# Patient Record
Sex: Female | Born: 1935 | Race: White | Hispanic: No | Smoking: Former smoker
Health system: Southern US, Community
[De-identification: ages and names within clinical notes are randomized; demographics above are authoritative.]

## PROBLEM LIST (undated history)

## (undated) DIAGNOSIS — I340 Nonrheumatic mitral (valve) insufficiency: Secondary | ICD-10-CM

## (undated) DIAGNOSIS — N183 Chronic kidney disease, stage 3 unspecified: Secondary | ICD-10-CM

## (undated) DIAGNOSIS — Z9981 Dependence on supplemental oxygen: Secondary | ICD-10-CM

## (undated) DIAGNOSIS — I5032 Chronic diastolic (congestive) heart failure: Secondary | ICD-10-CM

## (undated) DIAGNOSIS — I10 Essential (primary) hypertension: Secondary | ICD-10-CM

## (undated) DIAGNOSIS — I272 Pulmonary hypertension, unspecified: Secondary | ICD-10-CM

## (undated) DIAGNOSIS — J849 Interstitial pulmonary disease, unspecified: Secondary | ICD-10-CM

## (undated) DIAGNOSIS — G4733 Obstructive sleep apnea (adult) (pediatric): Secondary | ICD-10-CM

## (undated) DIAGNOSIS — R911 Solitary pulmonary nodule: Secondary | ICD-10-CM

## (undated) DIAGNOSIS — J9610 Chronic respiratory failure, unspecified whether with hypoxia or hypercapnia: Secondary | ICD-10-CM

## (undated) DIAGNOSIS — I071 Rheumatic tricuspid insufficiency: Secondary | ICD-10-CM

## (undated) HISTORY — DX: Solitary pulmonary nodule: R91.1

## (undated) HISTORY — DX: Interstitial pulmonary disease, unspecified: J84.9

## (undated) HISTORY — DX: Rheumatic tricuspid insufficiency: I07.1

## (undated) HISTORY — DX: Dependence on supplemental oxygen: Z99.81

## (undated) HISTORY — DX: Chronic respiratory failure, unspecified whether with hypoxia or hypercapnia: J96.10

## (undated) HISTORY — DX: Nonrheumatic mitral (valve) insufficiency: I34.0

## (undated) HISTORY — DX: Chronic diastolic (congestive) heart failure: I50.32

## (undated) HISTORY — DX: Essential (primary) hypertension: I10

## (undated) HISTORY — DX: Chronic kidney disease, stage 3 (moderate): N18.3

## (undated) HISTORY — DX: Pulmonary hypertension, unspecified: I27.20

## (undated) HISTORY — DX: Chronic kidney disease, stage 3 unspecified: N18.30

## (undated) HISTORY — DX: Obstructive sleep apnea (adult) (pediatric): G47.33

---

## 2014-08-13 ENCOUNTER — Encounter: Payer: Self-pay | Admitting: Interventional Cardiology

## 2014-08-13 ENCOUNTER — Ambulatory Visit (INDEPENDENT_AMBULATORY_CARE_PROVIDER_SITE_OTHER): Payer: Medicare Other | Admitting: Interventional Cardiology

## 2014-08-13 ENCOUNTER — Ambulatory Visit (HOSPITAL_BASED_OUTPATIENT_CLINIC_OR_DEPARTMENT_OTHER): Payer: Medicare Other | Attending: Interventional Cardiology

## 2014-08-13 VITALS — Ht 64.0 in | Wt 138.0 lb

## 2014-08-13 VITALS — BP 144/66 | HR 56 | Ht 64.0 in | Wt 138.0 lb

## 2014-08-13 DIAGNOSIS — I493 Ventricular premature depolarization: Secondary | ICD-10-CM | POA: Insufficient documentation

## 2014-08-13 DIAGNOSIS — Z9981 Dependence on supplemental oxygen: Secondary | ICD-10-CM | POA: Diagnosis not present

## 2014-08-13 DIAGNOSIS — I270 Primary pulmonary hypertension: Secondary | ICD-10-CM | POA: Diagnosis not present

## 2014-08-13 DIAGNOSIS — I272 Other secondary pulmonary hypertension: Secondary | ICD-10-CM | POA: Diagnosis not present

## 2014-08-13 DIAGNOSIS — I340 Nonrheumatic mitral (valve) insufficiency: Secondary | ICD-10-CM

## 2014-08-13 DIAGNOSIS — I5030 Unspecified diastolic (congestive) heart failure: Secondary | ICD-10-CM | POA: Diagnosis not present

## 2014-08-13 DIAGNOSIS — G4733 Obstructive sleep apnea (adult) (pediatric): Secondary | ICD-10-CM | POA: Insufficient documentation

## 2014-08-13 DIAGNOSIS — I10 Essential (primary) hypertension: Secondary | ICD-10-CM | POA: Diagnosis not present

## 2014-08-13 LAB — BASIC METABOLIC PANEL
BUN: 21 mg/dL (ref 6–23)
CO2: 38 mEq/L — ABNORMAL HIGH (ref 19–32)
Calcium: 9.6 mg/dL (ref 8.4–10.5)
Chloride: 94 mEq/L — ABNORMAL LOW (ref 96–112)
Creatinine, Ser: 1.18 mg/dL (ref 0.40–1.20)
GFR: 45.3 mL/min — ABNORMAL LOW (ref >60.00)
GLUCOSE: 93 mg/dL (ref 70–99)
Potassium: 4.3 mEq/L (ref 3.5–5.1)
Sodium: 135 mEq/L (ref 135–145)

## 2014-08-13 MED ORDER — FUROSEMIDE 20 MG PO TABS: 20.0000 mg | ORAL_TABLET | Freq: Every day | ORAL | Status: DC

## 2014-08-13 NOTE — Progress Notes (Signed)
Cardiology Office Note   Date:  08/13/2014   ID:  Jordan Barker, DOB 1918-05-18, MRN [id number]  PCP:  Ardyth Gal, MD  Cardiologist:   Lesleigh Noe, MD   Chief Complaint  Patient presents with  . Tricuspid Regurgitation      History of Present Illness: Jordan Barker is a 81 y.o. female who presents for chronic O2 therapy, moderate pulmonary hypertension, diastolic left heart failure, hypertension, and history of snoring.  The patient was working until she was hospitalized in February. She is 81 years of age and was working as a Catering manager. Starting in January she began dosing bilateral lower extremity swelling and dyspnea. The hospitalization at [facility] demonstrated diastolic dysfunction, and bilateral lower extremity edema that was felt to be secondary to acute diastolic heart failure. Her medication adjustments included addition of a loop diuretic with a dramatic decrease in weight of a proximally 20 pounds. She now has some orthostatic dizziness when she stands. She was discharged home on chronic O2 at 2 L/m. She is very concerned about the need for oxygen on a chronic basis. She is here for second opinion. She has not had chest pain. There is no prior history of DVT, pulmonary emboli, sleep apnea, or connective tissue disease. She never used anorexigenic's.    Past Medical History  Diagnosis Date  . On home oxygen therapy 08/13/2014    Commencing 05/2014   . Moderate to severe pulmonary hypertension 08/13/2014    Echo 05/2014 PASP 61 mmHg   . Moderate mitral regurgitation 08/13/2014    Echo 2016   . Essential hypertension 08/13/2014    No past surgical history on file.   Current Outpatient Prescriptions  Medication Sig Dispense Refill  . aspirin 81 MG tablet Take 81 mg by mouth daily.    . beta carotene w/minerals (OCUVITE) tablet Take 1 tablet by mouth daily.    Marland Kitchen doxazosin (CARDURA) 2 MG tablet Take 2 mg by mouth at bedtime.    . furosemide (LASIX) 20 MG tablet  Take 1 tablet (20 mg total) by mouth daily.    Marland Kitchen lisinopril (PRINIVIL,ZESTRIL) 40 MG tablet Take 40 mg by mouth daily.    . metoprolol (LOPRESSOR) 50 MG tablet Take 25 mg by mouth 2 (two) times daily.    Marland Kitchen omeprazole (PRILOSEC) 20 MG capsule Take 20 mg by mouth daily.    . OXYGEN 2L/MIN BY CANNULA    . Vitamin D, Ergocalciferol, (DRISDOL) 50000 UNITS CAPS capsule Take 50,000 Units by mouth once a week. MONDAYS    . Vitamin Mixture (VITAMIN E COMPLETE) CAPS Take 1 capsule by mouth daily as needed (DIZZINESS).     No current facility-administered medications for this visit.    Allergies:   Amlodipine; Celecoxib; Chlorthalidone; Hydrochlorothiazide; and Sulfa antibiotics    Social History:  The patient  reports that she has quit smoking. She does not have any smokeless tobacco history on file.   Family History:  The patient's family history includes Bone cancer in her brother; Cancer in her brother; Congestive Heart Failure in her brother; Heart disease in her brother and brother; Hypertension in her brother, father, and mother; Ovarian cancer in her mother; Parkinson's disease in her brother and father; Prostate cancer in her brother.    ROS:  Please see the history of present illness.   Otherwise, review of systems are positive for snoring, easy to fall asleep, sleeping less than 4 hours per night, exertional dyspnea, and O2 saturations in the  80s off of oxygen..   All other systems are reviewed and negative.    PHYSICAL EXAM: VS:  BP 144/66 mmHg  Pulse 56  Ht 5\' 4"  (1.626 m)  Wt 138 lb (62.596 kg)  BMI 23.68 kg/m2 , BMI Body mass index is 23.68 kg/(m^2). GEN: Well nourished, well developed, in no acute distress HEENT: normal Neck: no JVD, carotid bruits, or masses Cardiac: RRR; no murmurs, rubs, or gallops,no edema  Respiratory:  clear to auscultation bilaterally, normal work of breathing GI: soft, nontender, nondistended, + BS MS: no deformity or atrophy Skin: warm and dry, no  rash Neuro:  Strength and sensation are intact Psych: euthymic mood, full affect   EKG:  EKG is ordered today. The ekg ordered today demonstrates sinus bradycardia at 56 bpm with leftward axis.   Recent Labs: No results found for requested labs within last 365 days.    Lipid Panel No results found for: CHOL, TRIG, HDL, CHOLHDL, VLDL, LDLCALC, LDLDIRECT    Wt Readings from Last 3 Encounters:  08/13/14 138 lb (62.596 kg)      Other studies Reviewed: Additional studies/ records that were reviewed today include: [facility] Review of the above records demonstrates: Echocardiogram reviewed. Discharge summaries reviewed. Office visits reviewed. Identification of a patient with chronic hypoxic respiratory failure requiring 2 L of oxygen, acute diastolic left heart failure, accelerated hypertension, moderate pulmonary hypertension, moderate to severe tricuspid regurgitation. Evaluation for recurrent pulmonary emboli not performed.   ASSESSMENT AND PLAN:  Moderate to severe pulmonary hypertension: This appears to be secondary possibly to diastolic heart failure. Pulmonary emboli and sleep apnea have not been excluded.  Moderate mitral regurgitation: No structural abnormalities noted.  On home oxygen therapy: Requiring 2 L of oxygen per day  Essential hypertension: Controlled  Snoring/insomnia: Rule out sleep apnea     Current medicines are reviewed at length with the patient today.  The patient has concerns regarding medicines.  The following changes have been made:  Decrease furosemide to 20 mg per day. Sleep study while wearing oxygen. Pulmonary CT angiogram to rule out recurrent pulmonary emboli. Hold lisinopril and furosemide the day before and day of CT scan  Labs/ tests ordered today include:   Orders Placed This Encounter  Procedures  . CT Angio Chest W/Cm &/Or Wo Cm  . Basic metabolic panel  . EKG 12-Lead      Disposition:   FU with HS in 1 month PRN depending on findings Signed, Lesleigh NoeSMITH III,HENRY W, MD  08/13/2014 11:05 AM    [facility] [address], [place], [place]  [address] Phone: [phone]; Fax: [phone]

## 2014-08-13 NOTE — Patient Instructions (Signed)
Medication Instructions:  Your physician has recommended you make the following change in your medication:  REDUCE Lasix to 20mg  daily   Labwork: Bmet today  Testing/Procedures: Non-Cardiac CT Angiography (CTA), is a special type of CT scan that uses a computer to produce multi-dimensional views of major blood vessels throughout the body. In CT angiography, a contrast material is injected through an IV to help visualize the blood vessels  Your physician has recommended that you have a sleep study. This test records several body functions during sleep, including: brain activity, eye movement, oxygen and carbon dioxide blood levels, heart rate and rhythm, breathing rate and rhythm, the flow of air through your mouth and nose, snoring, body muscle movements, and chest and belly movement.   Follow-Up: Your physician recommends that you schedule a follow-up appointment pending results   Any Other Special Instructions Will Be Listed Below (If Applicable). PLEASE HOLD Lisinopril and Lasix the day before and the day of your CT  We will contact [facility] for you to have an at home sleep study. If for some reason it cannot be performed at home, we will need to have it done at [facility]

## 2014-08-14 ENCOUNTER — Ambulatory Visit (INDEPENDENT_AMBULATORY_CARE_PROVIDER_SITE_OTHER)
Admission: RE | Admit: 2014-08-14 | Discharge: 2014-08-14 | Disposition: A | Discharge status: Home / Self Care | Payer: Medicare Other | Source: Ambulatory Visit | Attending: Interventional Cardiology | Admitting: Interventional Cardiology

## 2014-08-14 DIAGNOSIS — I270 Primary pulmonary hypertension: Secondary | ICD-10-CM | POA: Diagnosis not present

## 2014-08-14 DIAGNOSIS — I272 Pulmonary hypertension, unspecified: Secondary | ICD-10-CM

## 2014-08-14 MED ORDER — IOHEXOL 350 MG/ML SOLN
80.0000 mL | Freq: Once | INTRAVENOUS | Status: AC | PRN
  Administered 2014-08-14: 80 mL via INTRAVENOUS

## 2014-08-20 ENCOUNTER — Telehealth: Payer: Self-pay | Admitting: Cardiology

## 2014-08-20 ENCOUNTER — Telehealth: Payer: Self-pay

## 2014-08-20 DIAGNOSIS — G4733 Obstructive sleep apnea (adult) (pediatric): Secondary | ICD-10-CM

## 2014-08-20 NOTE — Sleep Study (Signed)
   NAME: Jordan Barker DATE OF BIRTH:  06/02/1918 MEDICAL RECORD NUMBER [id number]  LOCATION: [facility]  PHYSICIAN: TURNER,TRACI R  DATE OF STUDY: 08/13/2014  SLEEP STUDY TYPE: Nocturnal Polysomnogram               REFERRING PHYSICIAN: Lyn RecordsSmith, Henry W, MD  INDICATION FOR STUDY: pulmonary HTN, excessive daytime sleepiness  EPWORTH SLEEPINESS SCORE: 11 HEIGHT: 5\' 4"  (162.6 cm)  WEIGHT: 138 lb (62.596 kg)    Body mass index is 23.68 kg/(m^2).  NECK SIZE: 14 in.  MEDICATIONS: Reviewed in the chart  SLEEP ARCHITECTURE: The patient slept for a total of 307 minutes out of a total sleep period of 403 minutes.  There was no slow wave sleep and 51 minutes of REM sleep.  The onset to sleep latency was normal at 5 minutes.  The onset to REM sleep latency was normal at 101 minutes.  The sleep efficiency was reduced at 75%.  RESPIRATORY DATA: There were 8 obstructive sleep apneas and 66 hypopneas.  The AHI was 14.4 events per hour consistent with mild to moderate obstructive sleep apnea/hypopnea syndrome.  Most events occurred in REM sleep and all occurred in the supine position.  There was mild snoring.  OXYGEN DATA: The average oxygen saturation was 86%.  The lowest oxygen saturation was 73%.  The time spent with oxygen saturations <88% was 195 minutes.    CARDIAC DATA: The patient maintained NSR with PVC's and PAC's.  The average heart rate was 62 bpm.  The lowest heart rate was 37 bpm and the maximum heart rate was 174 bpm.    MOVEMENT/PARASOMNIA: There were a few periodic limb movements noted with a PLMS index of 1.6 per hour.  There were no REM sleep behavior disorders noted.  IMPRESSION/ RECOMMENDATION:   1.  Mild to moderate obstructive sleep apnea/hypopnea syndrome with an AHI of 14.4 events per hour.  All events occurred in the supine position and most occurred during REM sleep. 2.  Severe oxygen desaturations associated respiratory events with average oxygen saturation  of 86% and lowest saturation 73%.  The time spent with oxygen saturations <88% was 195 minutes.  Patient normally on O2 at home 2L but study was done off Oxygen.   3.  Reduced sleep efficiency with increased frequency of awakenings.   4.  Abnormal sleep architecture with no slow wave sleep. 5.  PVC's and PAC's were noted throughout the study. 6.  Given degree of sleep disordered breathing with significant oxygen desaturations and pulmonary HTN, a trial of CPAP therapy is recommended.  Recommend referring patient for CPAP titration.   Signed: Quintella ReichertURNER,TRACI R Diplomate, American Board of Sleep Medicine  ELECTRONICALLY SIGNED ON:  08/20/2014, 12:25 PM [facility] PH: [phone]   FX: [phone] ACCREDITED BY THE AMERICAN ACADEMY OF SLEEP MEDICINE

## 2014-08-20 NOTE — Telephone Encounter (Signed)
Pt aware of CT results with verbal understanding.

## 2014-08-20 NOTE — Telephone Encounter (Signed)
Please let patient know that they have sleep apnea and recommend CPAP titration. Please set up titration in the sleep lab ASAP as she had severe oxygen desaturations.  She needs to continue Oxygen at night at home as she has been doing.

## 2014-08-20 NOTE — Telephone Encounter (Signed)
Pt aware of CT results with verbal understanding. No gross blood clots. Lung nodule.   Hardening of the arteries.  Overall, no major abnormality.

## 2014-08-20 NOTE — Telephone Encounter (Signed)
-----   Message from Henry W Smith, MD sent at 08/17/2014  1:00 AM EDT ----- No gross blood clots. Lung nodule.  Hardening of the arteries. Overall, no major abnormality. 

## 2014-08-20 NOTE — Telephone Encounter (Signed)
-----   Message from Lyn RecordsHenry W Smith, MD sent at 08/17/2014  1:00 AM EDT ----- No gross blood clots. Lung nodule.  Hardening of the arteries. Overall, no major abnormality.

## 2014-08-21 NOTE — Telephone Encounter (Signed)
Spoke to patients son, and they are aware of results and are okay with treatment plan. Orders have been put in for CPAP Titration, and a message has been sent for scheduling.

## 2014-08-21 NOTE — Addendum Note (Signed)
Addended by: Arcola JanskyOOK, Uriah Trueba M on: 08/21/2014 11:01 AM   Modules accepted: Orders

## 2014-10-04 ENCOUNTER — Encounter: Payer: Self-pay | Admitting: Interventional Cardiology

## 2014-10-04 ENCOUNTER — Ambulatory Visit (INDEPENDENT_AMBULATORY_CARE_PROVIDER_SITE_OTHER): Payer: Medicare Other | Admitting: Interventional Cardiology

## 2014-10-04 VITALS — BP 148/62 | HR 62 | Ht 64.0 in | Wt 143.0 lb

## 2014-10-04 DIAGNOSIS — J849 Interstitial pulmonary disease, unspecified: Secondary | ICD-10-CM

## 2014-10-04 DIAGNOSIS — I10 Essential (primary) hypertension: Secondary | ICD-10-CM

## 2014-10-04 DIAGNOSIS — Z9981 Dependence on supplemental oxygen: Secondary | ICD-10-CM | POA: Diagnosis not present

## 2014-10-04 DIAGNOSIS — I340 Nonrheumatic mitral (valve) insufficiency: Secondary | ICD-10-CM | POA: Diagnosis not present

## 2014-10-04 DIAGNOSIS — R911 Solitary pulmonary nodule: Secondary | ICD-10-CM

## 2014-10-04 DIAGNOSIS — G4733 Obstructive sleep apnea (adult) (pediatric): Secondary | ICD-10-CM | POA: Insufficient documentation

## 2014-10-04 DIAGNOSIS — I270 Primary pulmonary hypertension: Secondary | ICD-10-CM | POA: Diagnosis not present

## 2014-10-04 DIAGNOSIS — I272 Pulmonary hypertension, unspecified: Secondary | ICD-10-CM

## 2014-10-04 NOTE — Patient Instructions (Signed)
Medication Instructions:  Your physician recommends that you continue on your current medications as directed. Please refer to the Current Medication list given to you today.   Labwork: None   Testing/Procedures: None   Follow-Up: Your physician recommends that you schedule a follow-up appointment as needed  You have been referred to Pulmonology   Any Other Special Instructions Will Be Listed Below (If Applicable).

## 2014-10-04 NOTE — Progress Notes (Signed)
Cardiology Office Note   Date:  10/04/2014   ID:  Jordan Barker, DOB [birth year], MRN [id number]  PCP:  Ardyth Gal, MD  Cardiologist:  Lesleigh Noe, MD   Chief Complaint  Patient presents with  . Interstitial Lung Disease      History of Present Illness: Jordan Barker is a 81 y.o. female who presents for follow-up of pulmonary hypertension, hypoxia, obstructive sleep apnea, probable interstitial lung disease.  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 She was seen at [facility] and treated. An echo revealed right ventricular hypertrophy and pulmonary hypertension. She has severe hypoxemia and was discharged home on chronic oxygen therapy. She came to [place] for second opinion. In addition to pulmonary hypertension and hypoxia, we have identified sleep apnea, and an abnormal chest CT raising the question of interstitial lung disease and a pulmonary nodule. She has a past smoker.  CT angiogram done earlier this spring did not reveal evidence of pulmonary emboli:  IMPRESSION: 1. No filling defect is identified in the pulmonary arterial tree to suggest acute pulmonary embolus. 2. Mild right hilar adenopathy is nonspecific and could be inflammatory or neoplastic. 3. Average size 6 mm pulmonary nodule in the right lower lobe. If the patient is at high risk for bronchogenic carcinoma, follow-up chest CT at 6-12 months is recommended. If the patient is at low risk for bronchogenic carcinoma, follow-up chest CT at 12 months is recommended. This recommendation follows the consensus statement: Guidelines for Management of Small Pulmonary Nodules Detected on CT Scans: A Statement from the Fleischner Society as published in Radiology 2005;237:395-400. 4. Extensive coronary atherosclerosis with mild cardiomegaly. 5. Calcified mitral valve. 6. Scattered scarring or mild atelectasis in both lungs. 7. Mosaic attenuation in the lungs; vascular  attenuation in the darker segments suggests obstructive bronchiolitis or (less likely) residua from chronic pulmonary embolus.  Past Medical History  Diagnosis Date  . On home oxygen therapy 08/13/2014    Commencing 05/2014   . Moderate to severe pulmonary hypertension 08/13/2014    Echo 05/2014 PASP 61 mmHg   . Moderate mitral regurgitation 08/13/2014    Echo 2016   . Essential hypertension 08/13/2014    No past surgical history on file.   Current Outpatient Prescriptions  Medication Sig Dispense Refill  . aspirin 81 MG tablet Take 81 mg by mouth daily.    . beta carotene w/minerals (OCUVITE) tablet Take 1 tablet by mouth daily.    Marland Kitchen doxazosin (CARDURA) 2 MG tablet Take 2 mg by mouth at bedtime.    . furosemide (LASIX) 20 MG tablet Take 1 tablet (20 mg total) by mouth daily.    Marland Kitchen lisinopril (PRINIVIL,ZESTRIL) 40 MG tablet Take 40 mg by mouth daily.    . metoprolol (LOPRESSOR) 50 MG tablet Take 25 mg by mouth 2 (two) times daily.    Marland Kitchen omeprazole (PRILOSEC) 20 MG capsule Take 20 mg by mouth daily.    . OXYGEN 2L/MIN BY CANNULA    . Vitamin D, Ergocalciferol, (DRISDOL) 50000 UNITS CAPS capsule Take 50,000 Units by mouth once a week. MONDAYS    . Vitamin Mixture (VITAMIN E COMPLETE) CAPS Take 1 capsule by mouth daily as needed (DIZZINESS).     No current facility-administered medications for this visit.    Allergies:   Amlodipine; Celecoxib; Chlorthalidone; Hydrochlorothiazide; and Sulfa antibiotics    Social History:  The patient  reports that she has quit smoking. She does not have any smokeless tobacco history  on file.   Family History:  The patient's family history includes Bone cancer in her brother; Cancer in her brother; Congestive Heart Failure in her brother; Heart disease in her brother and brother; Hypertension in her brother, father, and mother; Ovarian cancer in her mother; Parkinson's disease in her brother and father; Prostate cancer in her brother.    ROS:  Please see  the history of present illness.   Otherwise, review of systems are positive for chronic cough, vision disturbance, anxiety, difficulty with balance, headaches, easy bruising..   All other systems are reviewed and negative.    PHYSICAL EXAM: VS:  BP 148/62 mmHg  Pulse 62  Ht 5\' 4"  (1.626 m)  Wt 64.864 kg (143 lb)  BMI 24.53 kg/m2  SpO2 99% , BMI Body mass index is 24.53 kg/(m^2). GEN: Well nourished, well developed, in no acute distress HEENT: normal Neck: no JVD, carotid bruits, or masses Cardiac: RRR; no murmurs, rubs, or gallops,no edema  Respiratory:  clear to auscultation bilaterally, normal work of breathing GI: soft, nontender, nondistended, + BS MS: no deformity or atrophy Skin: warm and dry, no rash Neuro:  Strength and sensation are intact Psych: euthymic mood, full affect   EKG:  EKG is not ordered today. Prior ECG in April did not reveal evidence of right ventricular hypertrophy or strain. Tracing was essentially normal for age.    Recent Labs: 08/13/2014: BUN 21; Creatinine, Ser 1.18; Potassium 4.3; Sodium 135    Lipid Panel No results found for: CHOL, TRIG, HDL, CHOLHDL, VLDL, LDLCALC, LDLDIRECT    Wt Readings from Last 3 Encounters:  10/04/14 64.864 kg (143 lb)  08/13/14 62.596 kg (138 lb)  08/13/14 62.596 kg (138 lb)      Other studies Reviewed: Additional studies/ records that were reviewed today include: View to all of the data that is been accumulated on the patient. This included records from [facility] Review of the above records demonstrates: Discussed the findings with the patient.   ASSESSMENT AND PLAN  Moderate to severe pulmonary hypertension with RVH by Echo  Essential hypertension  - controlled  Hypoxemia - this is multifactorial related to probable interstitial lung disease and sleep apnea.  Lung nodule - referral to Pulmonology  Interstitial lung disease ? bronchiolitis-referral to Pulmonology  Sleep apnea,  obstructive - not yet on C Pap therapy. The patient is wearing continuous oxygen.      Current medicines are reviewed at length with the patient today.  The patient does not have concerns regarding medicines.  The following changes have been made:  She will see Dr. Mayford Knife for management of obstructive sleep apnea. I will arrange for a pulmonary consultation with reference to the right lung nodule with adenopathy and the question bronchiolitis/interstitial lung disease. Clinical follow-up with cardiology will be as needed.  Labs/ tests ordered today include:   Orders Placed This Encounter  Procedures  . Ambulatory referral to Pulmonology     Disposition:   FU with HS in  PRN  Signed, Lesleigh Noe, MD  10/04/2014 10:21 AM    [facility] [address] [place], [place], [place]  [address] Phone: [phone]; Fax: [phone]

## 2014-10-16 ENCOUNTER — Ambulatory Visit (INDEPENDENT_AMBULATORY_CARE_PROVIDER_SITE_OTHER): Payer: Medicare Other | Admitting: Pulmonary Disease

## 2014-10-16 ENCOUNTER — Other Ambulatory Visit (INDEPENDENT_AMBULATORY_CARE_PROVIDER_SITE_OTHER): Payer: Medicare Other

## 2014-10-16 ENCOUNTER — Encounter: Payer: Self-pay | Admitting: Pulmonary Disease

## 2014-10-16 ENCOUNTER — Ambulatory Visit (INDEPENDENT_AMBULATORY_CARE_PROVIDER_SITE_OTHER)
Admission: RE | Admit: 2014-10-16 | Discharge: 2014-10-16 | Disposition: A | Discharge status: Home / Self Care | Payer: Medicare Other | Source: Ambulatory Visit | Attending: Pulmonary Disease | Admitting: Pulmonary Disease

## 2014-10-16 VITALS — BP 122/72 | HR 69 | Temp 98.0°F | Ht 62.0 in | Wt 146.2 lb

## 2014-10-16 DIAGNOSIS — R911 Solitary pulmonary nodule: Secondary | ICD-10-CM

## 2014-10-16 DIAGNOSIS — I38 Endocarditis, valve unspecified: Secondary | ICD-10-CM | POA: Insufficient documentation

## 2014-10-16 DIAGNOSIS — R0600 Dyspnea, unspecified: Secondary | ICD-10-CM

## 2014-10-16 DIAGNOSIS — I270 Primary pulmonary hypertension: Secondary | ICD-10-CM

## 2014-10-16 DIAGNOSIS — G4733 Obstructive sleep apnea (adult) (pediatric): Secondary | ICD-10-CM

## 2014-10-16 DIAGNOSIS — I10 Essential (primary) hypertension: Secondary | ICD-10-CM | POA: Diagnosis not present

## 2014-10-16 DIAGNOSIS — I272 Pulmonary hypertension, unspecified: Secondary | ICD-10-CM

## 2014-10-16 DIAGNOSIS — F419 Anxiety disorder, unspecified: Secondary | ICD-10-CM

## 2014-10-16 LAB — CBC WITH DIFFERENTIAL/PLATELET
BASOS ABS: 0 10*3/uL (ref 0.0–0.1)
BASOS PCT: 0.4 % (ref 0.0–3.0)
Eosinophils Absolute: 0.2 10*3/uL (ref 0.0–0.7)
Eosinophils Relative: 4.2 % (ref 0.0–5.0)
HCT: 28.6 % — ABNORMAL LOW (ref 36.0–46.0)
HEMOGLOBIN: 9.6 g/dL — AB (ref 12.0–15.0)
LYMPHS PCT: 26.8 % (ref 12.0–46.0)
Lymphs Abs: 1.5 10*3/uL (ref 0.7–4.0)
MCHC: 33.6 g/dL (ref 30.0–36.0)
MCV: 93.7 fl (ref 78.0–100.0)
MONOS PCT: 10.9 % (ref 3.0–12.0)
Monocytes Absolute: 0.6 10*3/uL (ref 0.1–1.0)
Neutro Abs: 3.3 10*3/uL (ref 1.4–7.7)
Neutrophils Relative %: 57.7 % (ref 43.0–77.0)
Platelets: 130 10*3/uL — ABNORMAL LOW (ref 150.0–400.0)
RBC: 3.06 Mil/uL — AB (ref 3.87–5.11)
RDW: 15.1 % (ref 11.5–15.5)
WBC: 5.7 10*3/uL (ref 4.0–10.5)

## 2014-10-16 LAB — HEPATIC FUNCTION PANEL
ALT: 11 U/L (ref 0–35)
AST: 21 U/L (ref 0–37)
Albumin: 4 g/dL (ref 3.5–5.2)
Alkaline Phosphatase: 61 U/L (ref 39–117)
BILIRUBIN TOTAL: 0.6 mg/dL (ref 0.2–1.2)
Bilirubin, Direct: 0.1 mg/dL (ref 0.0–0.3)
Total Protein: 6.8 g/dL (ref 6.0–8.3)

## 2014-10-16 LAB — BASIC METABOLIC PANEL
BUN: 16 mg/dL (ref 6–23)
CHLORIDE: 97 meq/L (ref 96–112)
CO2: 35 mEq/L — ABNORMAL HIGH (ref 19–32)
Calcium: 9.5 mg/dL (ref 8.4–10.5)
Creatinine, Ser: 1.21 mg/dL — ABNORMAL HIGH (ref 0.40–1.20)
GFR: 43.99 mL/min — ABNORMAL LOW (ref >60.00)
GLUCOSE: 114 mg/dL — AB (ref 70–99)
POTASSIUM: 4.3 meq/L (ref 3.5–5.1)
Sodium: 138 mEq/L (ref 135–145)

## 2014-10-16 LAB — TSH: TSH: 2.04 u[IU]/mL (ref 0.35–4.50)

## 2014-10-16 LAB — SEDIMENTATION RATE: SED RATE: 23 mm/h — AB (ref 0–22)

## 2014-10-16 LAB — BRAIN NATRIURETIC PEPTIDE: PRO B NATRI PEPTIDE: 906 pg/mL — AB (ref 0.0–100.0)

## 2014-10-16 NOTE — Progress Notes (Addendum)
Subjective:     Patient ID: Jordan Barker, female   DOB: 21-Jan-1919, 81 y.o.   MRN: [id number]  HPI 81 y/o WF, referred by Dr. Garnette Scheuermann ([facility]), for a pulmonary evaluation due to pulmonary nodule, pulmHTN, and hypoxemia>>  Her PCP is Dr. Ardyth Gal in [place] & I have reviewed the notes scanned into EPIC from [facility] physicians- Amparo Bristol, [facility], & DrMcGukln (Cards)...  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    Available data reveals hx of progressive dyspnea w/ chest pressure & some pedal edema, no cough/ sputum/ hemoptysis => Adm [facility] 05/2013 w/ hypoxemic RF, HBP, Valvular heart dis w/ modMR & mod to severeTR, DiastolicCHF (BNP was 8500), & PulmHTN (PASP~est60);  She was started on Oxygen, diuresed, & improved;  She had Cards f/u 07/03/14 w/ DrMcGukln in [place]> much improved w/ BP= 122/62 & w/o right heart failure signs or symptoms, asked to decr Lasix to prn edema or wt gain, continue the home O2...  [facility]14 showed mod LVH w/ norm LVF (EF=65-70%), no mention of diastolic dysfunction, mild to modMR, mod to severeTR, LA&RA enlargement, norm RV size & function w/ est RVSP=31mmHg...     She saw Dr. Garnette Scheuermann for a cardiac second opinion 08/13/14> he felt that her mod pulmHTN was likely due to her DiastolicCHF, modMR, mod to severe TR, acute on chr diastolicCHF  (He noted no prev hx DVT, PE, OSA, connective tissue disorders, or use of diet pills);  He did further evaluation including>   EKG 08/13/14 showed SBrady, rate56, otherw norm EKG...   CT Angio Chest 08/14/14 showed > NEG for PE, mild cardiomegaly &  atherosclerotic calcif in coronaries/ arch/ branch vessels, biapical pleuroparenchymal scarring + scattered scarring/atx, 6mm RLL pulm nodule, ?mosaic attenuation/ ?vasc attenuation mentioned by radiologist...   Sleep Study done 08/13/14 ([facility])> AHI=14 events per Hr (most in REM where the AHI=50 & all while supine); severe O2 desat down to 73%, few limb movements; Dr. Armanda Magic has been consulted & ordered  CPAP titration test.  EXAM reveals Afeb, VSS, O2sat=94% on O2 at 2L/min pulse dose;  HEENT- neg, mallampati1;  Chest- clear w/o w/r/r;  Heart- rr, gr1/6SEM w/o r/g;  Abd- soft, neg;  Ext- trace edema, no c/c...  CXR 10/16/14 showed heart at upper lim of norm in size, COPD/ clear lungs (nodule in RLL is not vis on CXR), NAD, +DJD in spine...   Spirometry 10/16/14 showed FVC=1.26 (60%), FEV1=0.70 (50%), %1sec=56, mid-flows are reduced at 43% predicted; suspect combined obstructive & restrictive pattern, consider Full PFTs at a later date...   Ambulatory oxygen saturation test 10/16/14 on O2 at 2L/min pulse dose>  O2sat=94% on 2L/min pulse at rest w/ HR=78/min; she ambulated 2 Laps on her O2 w/ lowest O2sat=90% w/ HR=86/min...  LABS 6/16:  Chems- ok x HCO3=35, BUN=16, Cr=1.21, BNP=906;  CBC- Hg=9.6, MCV=93.7, Plat=130K, Sed=23;  TSH=2.04;  QuantGold= POSITIVE  Further Labs w/ Fe=99 (41%sat), Ferritin=170, B12=634, MM panel showed norm Ig's but sl restricted mobility in IgG & Kappa lanes- suggest repeat in 52mo...   IMP/PLAN>>  Johonna's symptoms would seem  to place her in [facility] Group 3, & the likely etiology of her pulmHTN appears multifactorial w/ cardiac dis, OSA, poss combined pulmonary dis all playing a roll;  Currently she is on O2 at 2L/min & needs to continue this;  Cardiac problems- HBP, diastolicCHF w/ edema, valvular heart dis, and extensive coronary calcif is being managed and followed by DrSmith;  She has some mild edema and BNP=906 on her Lasix20, so we discussed  adding DIAMOX250mg /d for the next month w/ recheck;  OSA is being managed and followed by DrTurner;  Additionally she has a mod anemia and we will bring her back for an anemia work up to r/o other process here...    Past Medical History  Diagnosis Date  . On home oxygen therapy >> on Home O2 at 2L/min continuous... 08/13/2014    Commencing 05/2014   . Moderate to severe pulmonary hypertension 08/13/2014    Echo 05/2014 PASP 61 mmHg   . Moderate mitral regurgitation 08/13/2014    Echo 2016   . Essential hypertension >> on Metop50-1/2Bid, Lisin40, Cardura2, Lasix20 08/13/2014  . CHF (congestive heart failure)         --bilat LE edema & fatigue    GERD >. on Prilosec20     Vit D Deficiency >> on VitD 50K weekly     No past surgical history on file.  S/P Appendectomy & Hernia repair...   Outpatient Encounter Prescriptions as of 10/16/2014  Medication Sig  . aspirin 81 MG tablet Take 81 mg by mouth daily.  . beta carotene w/minerals (OCUVITE) tablet Take 1 tablet by mouth daily.  Marland Kitchen doxazosin (CARDURA) 2 MG tablet Take 1 mg by mouth 2 (two) times daily.   . furosemide (LASIX) 20 MG tablet Take 1 tablet (20 mg total) by mouth daily.  Marland Kitchen lisinopril (PRINIVIL,ZESTRIL) 40 MG tablet Take 40 mg by mouth daily.  . metoprolol (LOPRESSOR) 50 MG tablet Take 25 mg by mouth 2 (two) times daily.  Marland Kitchen omeprazole (PRILOSEC) 20 MG capsule Take 20 mg by mouth daily.  . OXYGEN 2L/MIN BY CANNULA  . Vitamin D, Ergocalciferol, (DRISDOL) 50000 UNITS CAPS capsule Take 50,000 Units by mouth once a week. MONDAYS  . Vitamin Mixture (VITAMIN E COMPLETE) CAPS Take 1 capsule by mouth daily as needed (DIZZINESS).   No facility-administered encounter medications on file as of 10/16/2014.    Allergies  Allergen Reactions  . Amlodipine Shortness Of Breath and Other (See Comments)    REACTION: "hot/cold flashes"  . Celecoxib Anxiety and Rash  . Chlorthalidone Anxiety and Rash  . Hydrochlorothiazide Anxiety and Rash  .  Sulfa Antibiotics Other (See Comments)    REACTION: "UNKNOWN...BEEN SO LONG AGO"    Immunization History  Administered Date(s) Administered  . Influenza-Unspecified 02/14/2014  . Pneumococcal Conjugate-13 09/26/2014    Family History  Problem Relation Age of Onset  . Hypertension Mother   . Ovarian cancer Mother   . Hypertension Father   . Parkinson's disease Father   . Heart disease Brother   . Cancer Brother   . Parkinson's disease Brother   . Prostate cancer Brother   . Heart disease Brother   . Hypertension Brother   . Congestive Heart Failure Brother   . Bone cancer Brother     History   Social History  . Marital Status: Divorced    Spouse Name: N/A  . Number of Children: N/A  . Years of Education: N/A   Occupational History  . Not on file.  Social History Main Topics  . Smoking status: Former Smoker -- 0.50 packs/day for 2 years    Types: Cigarettes    Quit date: 10/16/1946  . Smokeless tobacco: Not on file  . Alcohol Use: Not on file  . Drug Use: Not on file  . Sexual Activity: Not on file   Other Topics Concern  . Not on file   Social History Narrative    Current Medications, Allergies, Past Medical History, Past Surgical History, Family History, and Social History were reviewed in [employer] record.   Review of Systems            All symptoms NEG except where BOLDED >>  Constitutional:  F/C/S, fatigue, anorexia, unexpected weight change. HEENT:  HA, visual changes, hearing loss, earache, nasal symptoms, sore throat, mouth sores, hoarseness. Resp:  cough, sputum, hemoptysis; SOB, tightness, wheezing. Cardio:  CP, palpit, DOE, orthopnea, edema. GI:  N/V/D/C, blood in stool; reflux, abd pain, distention, gas. GU:  dysuria, freq, urgency, hematuria, flank pain, voiding difficulty. MS:  joint pain, swelling, tenderness, decr ROM; neck pain, back pain, etc. Neuro:  HA, tremors, seizures, dizziness, syncope, weakness,  numbness, gait abn. Skin:  suspicious lesions or skin rash. Heme:  adenopathy, bruising, bleeding. Psyche:  confusion, agitation, sleep disturbance, hallucinations, anxiety, depression suicidal.   Objective:   Physical Exam      Vital Signs:  Reviewed...  General:  WD, WN, 81 y/o WF in NAD, chr ill appearing; alert & oriented; pleasant & cooperative... HEENT:  [place]/AT; Conjunctiva- pink, Sclera- nonicteric, EOM-wnl, PERRLA, EACs-clear, TMs-wnl; NOSE-clear; THROAT-clear & wnl. Neck:  Supple w/ fair ROM; no JVD; normal carotid impulses w/o bruits; no thyromegaly or nodules palpated; no lymphadenopathy. Chest:  Clear to P & A; without wheezes, rales, or rhonchi heard. Heart:  Regular Rhythm; without murmurs, rubs, or gallops detected. Abdomen:  Soft & nontender- no guarding or rebound; normal bowel sounds; no organomegaly or masses palpated. Ext:  decrROM; +arthritic changes; no varicose veins, +venous insuffic, tr edema;  Pulses intact w/o bruits. Neuro:  No focal neuro deficits; gait normal & balance OK. Derm:  No lesions noted; no rash etc. Lymph:  No cervical, supraclavicular, axillary, or inguinal adenopathy palpated.   Assessment:      IMP >>  71 y/o woman Combined obstructive & restrictive lung dis-- see spirometry, remember she is 81 y/o, we will consider f/u w/ FullPFTs later ~40mm RLL pulmonary nodule-- we will plan f/u CT in 6 months Pulmonary HTN w/ PASP~60 by 2DEcho in [place] 2/16-- likely secondary to valvular heart dis, diastolic CHF, OSA, combined obstructive & restrictive lung dis...  Hypoxemia-- treated w/ home O2 at 2L/min 24/7... OSA-- sleep study done by DrSmith/ DrTurner & CPAP titration study pending... Pos Quantiferon Gold w/ hx remote Tb exposure, NEG CXR- no active dis...  Extensive coronary calcifications>> Valvular heart disease w/ modMR & TR>>     Further eval & Rx from [facility]- Dr.HSmith Diastolic CHF>>  HBP-- controlled on meds Anemia-- Hg=9.6 & further eval  pending...  PLAN >>  Jasnoor's symptoms would seem to place her in [facility] Group 3, & the likely etiology of her pulmHTN appears multifactorial w/ cardiac dis, OSA, poss combined pulmonary dis all playing a roll;  Currently she is on O2 at 2L/min & needs to continue this;  Cardiac problems- HBP, diastolicCHF w/ edema, valvular heart dis, and extensive coronary calcif is being managed and followed by DrSmith;  She has some mild edema and BNP=906 on  her Lasix20, so we discussed adding DIAMOX250mg /d for the next month w/ recheck;  OSA is being managed and followed by DrTurner;  Additionally she has a mod anemia and we will bring her back for an anemia work up to r/o other process here...     Plan:     Patient's Medications  New Prescriptions   ACETAZOLAMIDE (DIAMOX) 250 MG TABLET    Take 1 tablet (250 mg total) by mouth daily.  Previous Medications   ASPIRIN 81 MG TABLET    Take 81 mg by mouth daily.   BETA CAROTENE W/MINERALS (OCUVITE) TABLET    Take 1 tablet by mouth daily.   DOXAZOSIN (CARDURA) 2 MG TABLET    Take 1 mg by mouth 2 (two) times daily.    FUROSEMIDE (LASIX) 20 MG TABLET    Take 1 tablet (20 mg total) by mouth daily.   LISINOPRIL (PRINIVIL,ZESTRIL) 40 MG TABLET    Take 40 mg by mouth daily.   METOPROLOL (LOPRESSOR) 50 MG TABLET    Take 25 mg by mouth 2 (two) times daily.   OMEPRAZOLE (PRILOSEC) 20 MG CAPSULE    Take 20 mg by mouth daily.   OXYGEN    2L/MIN BY CANNULA   VITAMIN D, ERGOCALCIFEROL, (DRISDOL) 50000 UNITS CAPS CAPSULE    Take 50,000 Units by mouth once a week. MONDAYS   VITAMIN MIXTURE (VITAMIN E COMPLETE) CAPS    Take 1 capsule by mouth daily as needed (DIZZINESS).  Modified Medications   No medications on file  Discontinued Medications   No medications on file

## 2014-10-16 NOTE — Patient Instructions (Signed)
Miss Steve Rattlerell-  It was nice meeting you today...  Today we did a pulmonary function test, an ambulatory oxygen test, a routine CXR & some blood work...    We will contact you w/ the results when available...   Continue the home OXYGEN at 2L/min flow rate...    And continue your current cardiac & fluid medications...  We will keep our eye on the lung nodule w/ a follow up scan in several months...  Be sure to get the CPAP titration test for further evaluation of your sleep apnea  & follow up w/ DrTurner regarding treatment w/ a CPAP machine...  It is hoped that treatment of your heart condition & the sleep apnea will improve the pressure in the pulmonary arteries...    Currently you need to stay on the Oxygen at the 2L/min flow rate...   Call for any questions...  Let's plan a follow up visit in 2-3 months.Marland Kitchen..Marland Kitchen

## 2014-10-17 ENCOUNTER — Telehealth: Payer: Self-pay | Admitting: Pulmonary Disease

## 2014-10-17 DIAGNOSIS — D539 Nutritional anemia, unspecified: Secondary | ICD-10-CM

## 2014-10-17 MED ORDER — ACETAZOLAMIDE 250 MG PO TABS: 250.0000 mg | ORAL_TABLET | Freq: Every day | ORAL | Status: DC

## 2014-10-17 NOTE — Telephone Encounter (Signed)
[phone], pt cb

## 2014-10-17 NOTE — Telephone Encounter (Signed)
Result Notes     Notes Recorded by Maurice March, RN on 10/17/2014 at 11:02 AM lmtcb for pt. ------  Notes Recorded by Noralee Space, MD on 10/16/2014 at 4:54 PM Please notify patient>  CXR shows some chronic changes, heart at upper lim of norm in size, nodule in RLL is NOT vis on plain CXR (very small), some arthritis in spine, NAD...        Notes Recorded by Maurice March, RN on 10/17/2014 at 11:02 AM lmtcb for pt. Notes Recorded by Noralee Space, MD on 10/16/2014 at 4:52 PM Please notify patient>  Chems show sl elev HCO3=35, Cr=1.21, & BNP=906 (fluid blood test)... REC to continue current meds and ADD DIAMOX 291m #30- one po Qd in the afternoon... CBC shows anemia w/ Hg=9.6 & this needs further eval... REC ret to our lab for Fe/ TIBC/ Ferritin/ B12/ & MM panel... Tell her I will need to recheck her in 1 month please...   lmtcb x2 for pt.

## 2014-10-17 NOTE — Progress Notes (Signed)
Quick Note:  lmtcb for pt. ______ 

## 2014-10-17 NOTE — Telephone Encounter (Signed)
Spoke with pt regarding results. She verbalized understanding. She will go down to lab for the blood work. Diamox sent in. appt scheduled to see SN 11/12/14.

## 2014-10-18 ENCOUNTER — Other Ambulatory Visit (INDEPENDENT_AMBULATORY_CARE_PROVIDER_SITE_OTHER): Payer: Medicare Other

## 2014-10-18 DIAGNOSIS — D539 Nutritional anemia, unspecified: Secondary | ICD-10-CM | POA: Diagnosis not present

## 2014-10-18 LAB — VITAMIN B12: Vitamin B-12: 634 pg/mL (ref 211–911)

## 2014-10-18 LAB — IRON AND TIBC
%SAT: 41 % (ref 20–55)
Iron: 99 ug/dL (ref 42–145)
TIBC: 243 ug/dL — ABNORMAL LOW (ref 250–470)
UIBC: 144 ug/dL (ref 125–400)

## 2014-10-18 LAB — QUANTIFERON TB GOLD ASSAY (BLOOD)
Interferon Gamma Release Assay: POSITIVE — AB
QUANTIFERON NIL VALUE: 0.23 [IU]/mL
Quantiferon Tb Ag Minus Nil Value: 0.36 IU/mL
TB AG VALUE: 0.59 [IU]/mL

## 2014-10-18 LAB — FERRITIN: Ferritin: 169.6 ng/mL (ref 10.0–291.0)

## 2014-10-23 ENCOUNTER — Other Ambulatory Visit: Payer: Self-pay

## 2014-10-23 ENCOUNTER — Other Ambulatory Visit: Payer: Self-pay | Admitting: Interventional Cardiology

## 2014-10-23 DIAGNOSIS — I272 Pulmonary hypertension, unspecified: Secondary | ICD-10-CM

## 2014-10-23 LAB — MULTIPLE MYELOMA PANEL, SERUM
ABNORMAL PROTEIN BAND2: NOT DETECTED g/dL
ALPHA-1-GLOBULIN: 0.3 g/dL (ref 0.2–0.3)
Abnormal Protein Band3: NOT DETECTED g/dL
Albumin ELP: 3.7 g/dL — ABNORMAL LOW (ref 3.8–4.8)
Alpha-2-Globulin: 0.6 g/dL (ref 0.5–0.9)
Beta 2: 0.2 g/dL (ref 0.2–0.5)
Beta Globulin: 0.3 g/dL — ABNORMAL LOW (ref 0.4–0.6)
GAMMA GLOBULIN: 0.9 g/dL (ref 0.8–1.7)
IgA: 219 mg/dL (ref 69–380)
IgG (Immunoglobin G), Serum: 1070 mg/dL (ref 690–1700)
IgM, Serum: 61 mg/dL (ref 52–322)
TOTAL PROTEIN, SERUM ELECTROPHOR: 6 g/dL — AB (ref 6.1–8.1)

## 2014-10-23 MED ORDER — FUROSEMIDE 20 MG PO TABS: 20.0000 mg | ORAL_TABLET | Freq: Every day | ORAL | Status: DC

## 2014-10-31 ENCOUNTER — Ambulatory Visit (HOSPITAL_BASED_OUTPATIENT_CLINIC_OR_DEPARTMENT_OTHER): Payer: Medicare Other | Attending: Cardiology

## 2014-10-31 DIAGNOSIS — G4733 Obstructive sleep apnea (adult) (pediatric): Secondary | ICD-10-CM | POA: Diagnosis not present

## 2014-10-31 DIAGNOSIS — I493 Ventricular premature depolarization: Secondary | ICD-10-CM | POA: Insufficient documentation

## 2014-10-31 DIAGNOSIS — R0683 Snoring: Secondary | ICD-10-CM | POA: Insufficient documentation

## 2014-10-31 DIAGNOSIS — G4730 Sleep apnea, unspecified: Secondary | ICD-10-CM | POA: Diagnosis present

## 2014-11-12 ENCOUNTER — Encounter: Payer: Self-pay | Admitting: Pulmonary Disease

## 2014-11-12 ENCOUNTER — Other Ambulatory Visit (INDEPENDENT_AMBULATORY_CARE_PROVIDER_SITE_OTHER): Payer: Medicare Other

## 2014-11-12 ENCOUNTER — Ambulatory Visit (INDEPENDENT_AMBULATORY_CARE_PROVIDER_SITE_OTHER): Payer: Medicare Other | Admitting: Pulmonary Disease

## 2014-11-12 VITALS — BP 136/60 | HR 76 | Temp 97.2°F | Wt 141.4 lb

## 2014-11-12 DIAGNOSIS — R0600 Dyspnea, unspecified: Secondary | ICD-10-CM

## 2014-11-12 DIAGNOSIS — I340 Nonrheumatic mitral (valve) insufficiency: Secondary | ICD-10-CM | POA: Diagnosis not present

## 2014-11-12 DIAGNOSIS — R911 Solitary pulmonary nodule: Secondary | ICD-10-CM

## 2014-11-12 DIAGNOSIS — I10 Essential (primary) hypertension: Secondary | ICD-10-CM

## 2014-11-12 DIAGNOSIS — I38 Endocarditis, valve unspecified: Secondary | ICD-10-CM | POA: Diagnosis not present

## 2014-11-12 DIAGNOSIS — G4733 Obstructive sleep apnea (adult) (pediatric): Secondary | ICD-10-CM

## 2014-11-12 DIAGNOSIS — I270 Primary pulmonary hypertension: Secondary | ICD-10-CM

## 2014-11-12 DIAGNOSIS — I272 Pulmonary hypertension, unspecified: Secondary | ICD-10-CM

## 2014-11-12 LAB — BASIC METABOLIC PANEL
BUN: 22 mg/dL (ref 6–23)
CALCIUM: 9.2 mg/dL (ref 8.4–10.5)
CO2: 31 mEq/L (ref 19–32)
Chloride: 104 mEq/L (ref 96–112)
Creatinine, Ser: 1.32 mg/dL — ABNORMAL HIGH (ref 0.40–1.20)
GFR: 39.78 mL/min — ABNORMAL LOW (ref >60.00)
Glucose, Bld: 82 mg/dL (ref 70–99)
Potassium: 4.2 mEq/L (ref 3.5–5.1)
SODIUM: 140 meq/L (ref 135–145)

## 2014-11-12 LAB — BRAIN NATRIURETIC PEPTIDE: PRO B NATRI PEPTIDE: 866 pg/mL — AB (ref 0.0–100.0)

## 2014-11-12 NOTE — Progress Notes (Signed)
Subjective:     Patient ID: Jordan Barker, female   DOB: December 13, 1918, 81 y.o.   MRN: [id number]  HPI ~  October 16, 2014:  Initial pulmonary consult w/ SN>>      78 y/o WF, referred by Dr. Garnette Scheuermann (CARDS), for a pulmonary evaluation due to pulmonary nodule, pulmHTN, and hypoxemia>>  Her PCP is Dr. Ardyth Gal in [place] & I have reviewed the notes scanned into EPIC from [facility] physicians- Amparo Bristol, [facility], & DrMcGukln (Cards)...  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    Available data reveals hx of progressive dyspnea w/ chest pressure & some pedal edema, no cough/ sputum/ hemoptysis => Adm [facility]PR 05/2013 w/ hypoxemic RF, HBP, Valvular heart dis w/ modMR & mod to severeTR, DiastolicCHF (BNP was 8500), & PulmHTN (PASP~est60);  She was started on Oxygen, diuresed, & improved;  She had Cards f/u 07/03/14 w/ DrMcGukln in [place]> much improved w/ BP= 122/62 & w/o right heart failure signs or symptoms, asked to decr Lasix to prn edema or wt gain, continue the home O2...  [facility]cho at [facility] 06/03/14 showed mod LVH w/ norm LVF (EF=65-70%), no mention of diastolic dysfunction, mild to modMR, mod to severeTR, LA&RA enlargement, norm RV size & function w/ est RVSP=9mmHg...     She saw Dr. Garnette Scheuermann for a cardiac second opinion 08/13/14> he felt that her mod pulmHTN was likely due to her DiastolicCHF, modMR, mod to severe TR, acute on chr diastolicCHF  (He noted no prev hx DVT, PE, OSA, connective tissue disorders, or use of diet pills);  He did further evaluation including>   EKG 08/13/14 showed SBrady, rate56, otherw norm EKG...   CT Angio Chest  08/14/14 showed > NEG for PE, mild cardiomegaly & atherosclerotic calcif in coronaries/ arch/ branch vessels, biapical pleuroparenchymal scarring + scattered scarring/atx, 6mm RLL pulm nodule, ?mosaic attenuation/ ?vasc attenuation mentioned by radiologist...   Sleep Study done 08/13/14 ([facility])> AHI=14 events per Hr (most in REM where the AHI=50 & all while supine); severe O2 desat down to 73%, few limb movements; Dr. Armanda Magic has been consulted & ordered  CPAP titration test.  EXAM reveals Afeb, VSS, O2sat=94% on O2 at 2L/min pulse dose;  HEENT- neg, mallampati1;  Chest- clear w/o w/r/r;  Heart- rr, gr1/6SEM w/o r/g;  Abd- soft, neg;  Ext- trace edema, no c/c...  CXR 10/16/14 showed heart at upper lim of norm in size, COPD/ clear lungs (nodule in RLL is not vis on CXR), NAD, +DJD in spine...   Spirometry 10/16/14 showed FVC=1.26 (60%), FEV1=0.70 (50%), %1sec=56, mid-flows are reduced at 43% predicted; suspect combined obstructive & restrictive pattern, consider Full PFTs at a later date...   Ambulatory oxygen saturation test 10/16/14 on O2 at 2L/min pulse dose>  O2sat=94% on 2L/min pulse at rest w/ HR=78/min; she ambulated 2 Laps on her O2 w/ lowest O2sat=90% w/ HR=86/min...  LABS 6/16:  Chems- ok x HCO3=35, BUN=16, Cr=1.21, BNP=906;  CBC- Hg=9.6, MCV=93.7, Plat=130K, Sed=23;  TSH=2.04;  QuantGold= POSITIVE  Further Labs w/ Fe=99 (41%sat), Ferritin=170, B12=634, MM panel showed norm Ig's but sl restricted mobility in  IgG & Kappa lanes- suggest repeat in 72mo...   IMP/PLAN>>  Jordan Barker's symptoms would seem to place her in WHO Group 3, & the likely etiology of her pulmHTN appears multifactorial w/ cardiac dis, OSA, poss combined pulmonary dis all playing a roll;  Currently she is on O2 at 2L/min & needs to continue this;  Cardiac problems- HBP, diastolicCHF w/ edema, valvular heart dis, and extensive coronary calcif is being managed and followed by DrSmith;  She has some mild  edema and BNP=906 on her Lasix20, so we discussed adding DIAMOX250mg /d for the next month w/ recheck;  OSA is being managed and followed by DrTurner;  Additionally she has a mod anemia and we will bring her back for an anemia work up to r/o other process here...  ~  November 12, 2014:  64mo ROV and Jordan Barker has lost 5# on the combination of Lasix20 & Diamox250; she states that he breathing is about the same- no real change noted (waqlking is ok indoors but notes same SOB when walking outside), she denies CP/ pressure but is c/o some back discomfort in upper back which she blames on slumping posture... 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    6mm RLL pulmonary nodule on CT Chest>  This is asymptomatic and incidental, we will plan f/u CT in 6 months.    Pulmonary HTN w/ PASP~60 by 2DEcho in [place] 2/16> on O2 at 2L/min 24/7; this is most likely multifactorial secondary to valvular heart dis, diastolic CHF, OSA, combined obstructive & restrictive lung dis...     Hypoxemia> treated w/ home O2 at 2L/min 24/7...    OSA> sleep study done by DrSmith/ DrTurner & CPAP titration study pending; she needs to get started on CPAP ASAP w/ documentation of efficacy & no nocturnal hypoxemia as part of her PulmHTN protocol...    Pos Quantiferon Gold> w/ hx remote Tb exposure, NEG CXR- no active dis...    Extensive coronary calcifications> on ASA81 and followed by DrHSmith...    Valvular heart disease w/ modMR & TR> followed by DrHSmith for Cards...    Diastolic CHF> on [id number], Lisin40, Cardura2Bid, Lasix20, Diamox250; Labs today showed BUN=22, Cr=1.32, K=4.2, BNP=866    HBP> controlled on meds above & well contrlled w/ BP= 136/60 today, denies CP/ palpit/ edema...    Anemia> we did prelim eval w/ Hg=9.6, MCV=94, Sed=23, Fe=99  (41%sat), Ferritin=170, B12=634, SPE/IEP showed no M-spike, but area of slightly restricted mobility in the IgG and Kappa lanes noted- plan repeat in 72mo EXAM reveals Afeb, VSS, O2sat=90% on O2 at 2L/min pulse dose;  HEENT- neg, mallampati1;  Chest- clear w/o w/r/r;  Heart- rr, gr1/6SEM w/o r/g;  Abd- soft, neg;  Ext- trace edema, no c/c...  LABS 7/16:  Chems- ok w/ BUN=22, Cr=1.32, K=4.2, but BNP=866 (prev 906) IMP/PLAN>>  She will continue the O2 at 2L/min 24/7;  I am reluctant to push the diuretics any further w/ Cr=1.32 but BNP remains elev;  We will add ANORO one puff daily as a trial;  She needs ROV w/ Cards- DrHSmith & has yet to see DrTurner for her OSA- CPAP titration test pending;  Ultimately when all treatments in place & optimized they will want to recheck 2DEcho to see if PASP has improved, otherw  might be a candidate for RHC & PH therapy despite her age...     Past Medical History  Diagnosis Date  . On home oxygen therapy >> on Home O2 at 2L/min continuous... 08/13/2014    Commencing 05/2014   . Moderate to severe pulmonary hypertension 08/13/2014    Echo 05/2014 PASP 61 mmHg   . Moderate mitral regurgitation 08/13/2014    Echo 2016   . Essential hypertension >> on Metop50-1/2Bid, Lisin40, Cardura2, Lasix20 08/13/2014  . CHF (congestive heart failure)         --bilat LE edema & fatigue    GERD >. on Prilosec20     Vit D Deficiency >> on VitD 50K weekly     No past surgical history on file.  S/P Appendectomy & Hernia repair...   Outpatient Encounter Prescriptions as of 11/12/2014  Medication Sig  . acetaZOLAMIDE (DIAMOX) 250 MG tablet Take 1 tablet (250 mg total) by mouth daily.  Marland Kitchen aspirin 81 MG tablet Take 81 mg by mouth daily.  . beta carotene w/minerals (OCUVITE) tablet Take 1 tablet by mouth daily.  Marland Kitchen doxazosin (CARDURA) 2 MG tablet Take 1 mg by mouth 2 (two) times daily.   . furosemide (LASIX) 20 MG tablet Take 1 tablet (20 mg total) by mouth daily.  Marland Kitchen lisinopril  (PRINIVIL,ZESTRIL) 40 MG tablet Take 40 mg by mouth daily.  . metoprolol (LOPRESSOR) 50 MG tablet Take 25 mg by mouth 2 (two) times daily.  Marland Kitchen omeprazole (PRILOSEC) 20 MG capsule Take 20 mg by mouth daily.  . OXYGEN 2L/MIN BY CANNULA  . Vitamin D, Ergocalciferol, (DRISDOL) 50000 UNITS CAPS capsule Take 50,000 Units by mouth once a week. MONDAYS  . Vitamin Mixture (VITAMIN E COMPLETE) CAPS Take 1 capsule by mouth daily as needed (DIZZINESS).   No facility-administered encounter medications on file as of 11/12/2014.    Allergies  Allergen Reactions  . Amlodipine Shortness Of Breath and Other (See Comments)    REACTION: "hot/cold flashes"  . Celecoxib Anxiety and Rash  . Chlorthalidone Anxiety and Rash  . Hydrochlorothiazide Anxiety and Rash  . Sulfa Antibiotics Other (See Comments)    REACTION: "UNKNOWN...BEEN SO LONG AGO"    Current Medications, Allergies, Past Medical History, Past Surgical History, Family History, and Social History were reviewed in [employer] record.   Review of Systems            All symptoms NEG except where BOLDED >>  Constitutional:  F/C/S, fatigue, anorexia, unexpected weight change. HEENT:  HA, visual changes, hearing loss, earache, nasal symptoms, sore throat, mouth sores, hoarseness. Resp:  cough, sputum, hemoptysis; SOB, tightness, wheezing. Cardio:  CP, palpit, DOE, orthopnea, edema. GI:  N/V/D/C, blood in stool; reflux, abd pain, distention, gas. GU:  dysuria, freq, urgency, hematuria, flank pain, voiding difficulty. MS:  joint pain, swelling, tenderness, decr ROM; neck pain, back pain, etc. Neuro:  HA, tremors, seizures, dizziness, syncope, weakness, numbness, gait abn. Skin:  suspicious lesions or skin rash. Heme:  adenopathy, bruising, bleeding. Psyche:  confusion, agitation, sleep disturbance, hallucinations, anxiety, depression suicidal.   Objective:   Physical Exam      Vital Signs:  Reviewed...  General:  WD, WN,  81 y/o WF in NAD, chr ill appearing; alert & oriented; pleasant & cooperative... HEENT:  Central/AT; Conjunctiva- pink, Sclera- nonicteric, EOM-wnl, PERRLA, EACs-clear, TMs-wnl; NOSE-clear; THROAT-clear & wnl. Neck:  Supple w/ fair ROM; no JVD; normal carotid impulses w/o bruits; no thyromegaly or nodules palpated; no lymphadenopathy. Chest:  Clear to P & A; without wheezes, rales, or rhonchi heard. Heart:  Regular Rhythm; without murmurs, rubs, or gallops detected. Abdomen:  Soft & nontender- no guarding or rebound; normal bowel sounds; no organomegaly or masses palpated. Ext:  decrROM; +arthritic changes; no varicose veins, +venous insuffic, tr edema;  Pulses intact w/o bruits. Neuro:  No focal neuro deficits; gait normal & balance OK. Derm:  No lesions noted; no rash etc. Lymph:  No cervical, supraclavicular, axillary, or inguinal adenopathy palpated.   Assessment:      IMP >>  78 y/o woman w/     Combined obstructive & restrictive lung dis> see spirometry, remember she is 81 y/o, we will consider f/u w/ FullPFTs later; for now add ANORO one puff daily...    6mm RLL pulmonary nodule> we will plan f/u CT in 6 months    Pulmonary HTN w/ PASP~60 by 2DEcho in [place] 2/16> likely secondary to valvular heart dis, diastolic CHF, OSA, combined obstructive & restrictive lung dis...     Hypoxemia> treated w/ home O2 at 2L/min 24/7...    Pos Quantiferon Gold w/ hx remote Tb exposure, NEG CXR- no active dis...    Anemia-- Hg=9.6 (?ACD) w/ norm Fe/ B12/ and neg SPE/IEP so far...  Sleep Apnea evaluated & treated by DrTTurner:     OSA> sleep study done by DrSmith/ DrTurner & CPAP titration study pending- needs to get started on the CPAP to optimize her treatments...  Cardiac eval and treatment per DrHSmith:     HBP> controlled on meds- ASA81, Metop25Bid, Lisin40, Cardura2Bid, Lasix20, Diamox250    Extensive coronary calcifications>    Valvular heart disease w/ modMR & TR>        Diastolic CHF>     Pulmonary HTN w/ PASP~60 by 2DEcho in [place] 2/16> likely secondary to valvular heart dis, diastolic CHF, OSA, combined obstructive & restrictive lung dis...   PLAN >>  Jordan Barker's symptoms would seem to place her in [facility] Group 3 & the likely etiology of her pulmHTN appears multifactorial w/ cardiac dis, OSA, poss combined pulmonary dis all playing a roll;  Currently she is on O2 at 2L/min & needs to continue this;  Cardiac problems- HBP, diastolicCHF w/ edema, valvular heart dis, and extensive coronary calcif is being managed and followed by DrSmith;  OSA is being managed and followed by DrTurner;  I am reluctant to push the diuretics any further w/ Cr=1.32 but BNP remains elev;  We will add ANORO one puff daily as a trial;  She needs ROV w/ Cards- DrHSmith & has yet to see DrTurner for her OSA- CPAP titration test pending;  Ultimately when all treatments in place & optimized they will want to recheck 2DEcho to see if PASP has improved, otherw might be a candidate for RHC & PH therapy despite her age.     Plan:     Patient's Medications  New Prescriptions   No medications on file  Previous Medications   ACETAZOLAMIDE (DIAMOX) 250 MG TABLET    Take 1 tablet (250 mg total) by mouth daily.   ASPIRIN 81 MG TABLET    Take 81 mg by mouth daily.   BETA CAROTENE W/MINERALS (OCUVITE) TABLET    Take 1 tablet by mouth daily.   DOXAZOSIN (CARDURA) 2 MG TABLET    Take 1 mg by mouth 2 (two) times daily.    FUROSEMIDE (LASIX) 20 MG TABLET    Take 1 tablet (20 mg total) by mouth daily.   LISINOPRIL (  PRINIVIL,ZESTRIL) 40 MG TABLET    Take 40 mg by mouth daily.   METOPROLOL (LOPRESSOR) 50 MG TABLET    Take 25 mg by mouth 2 (two) times daily.   OMEPRAZOLE (PRILOSEC) 20 MG CAPSULE    Take 20 mg by mouth daily.   OXYGEN    2L/MIN BY CANNULA   VITAMIN D, ERGOCALCIFEROL, (DRISDOL) 50000 UNITS CAPS CAPSULE    Take 50,000 Units by mouth once a week. MONDAYS   VITAMIN MIXTURE (VITAMIN E COMPLETE) CAPS    Take 1 capsule by mouth  daily as needed (DIZZINESS).  Modified Medications   No medications on file  Discontinued Medications   No medications on file

## 2014-11-12 NOTE — Patient Instructions (Signed)
Today we updated your med list in our EPIC system...    Continue your current medications the same...  We decided to add [facility] inhaler- one inhalation daily, every day....  Today we rechecked your fluid blood tests...    We will contact you w/ the results and decide if we need to adjust your medication doses...  Remember- NO SALT!  Call for any questions...   Let's plan a follow up visit in 6 weeks time.Marland KitchenMarland Kitchen

## 2014-11-18 ENCOUNTER — Other Ambulatory Visit: Payer: Self-pay | Admitting: Pulmonary Disease

## 2014-11-18 MED ORDER — ACETAZOLAMIDE 250 MG PO TABS: 250.0000 mg | ORAL_TABLET | Freq: Every day | ORAL | Status: DC

## 2014-11-23 ENCOUNTER — Telehealth: Payer: Self-pay | Admitting: Cardiology

## 2014-11-23 NOTE — Sleep Study (Signed)
SLEEP STUDY   Patient Name: Jordan Barker, Jordan Barker Date: 10/31/2014 MRN: [id number] Gender: Female D.O.B: 16-Apr-1919 Age (years): 66 Referring Provider: Armanda Magic MD, ABSM Interpreting Physician: Armanda Magic MD, ABSM RPSGT: Melburn Popper  Height (inches): 64 Weight (lbs): 138 BMI: 24 Neck Size: 14.00  CLINICAL INFORMATION The patient is referred for a CPAP titration to treat sleep apnea.  Date of NPSG, Split Night or HST:08/13/2014  SLEEP STUDY TECHNIQUE As per the AASM Manual for the Scoring of Sleep and Associated Events v2.3 (April 2016) with a hypopnea requiring 4% desaturations.  The channels recorded and monitored were frontal, central and occipital EEG, electrooculogram (EOG), submentalis EMG (chin), nasal and oral airflow, thoracic and abdominal wall motion, anterior tibialis EMG, snore microphone, electrocardiogram, and pulse oximetry. Continuous positive airway pressure (CPAP) was initiated at the beginning of the study and titrated to treat sleep-disordered breathing.  MEDICATIONS Medications taken by the patient : Diamox, ASA, Cardura, Lasix, Lisinopril, metoprolol, prilosec, Oxygen Medications administered by patient during sleep study : No sleep medicine administered.  TECHNICIAN COMMENTS Comments added by technician: NONE  Comments added by scorer: N/A  RESPIRATORY PARAMETERS Optimal PAP Pressure (cm): 5    AHI at Optimal Pressure (/hr):0.3 Overall Minimal O2 (%):91.00     Supine % at Optimal Pressure (%):100 on O2 at 2L Minimal O2 at Optimal Pressure (%):95.0 on O2 at 2L      SLEEP ARCHITECTURE The study was initiated at 11:07:26 PM and ended at 5:19:17 AM.  Sleep onset time was 21.2 minutes and the sleep efficiency was reduced at 63.1%. The total sleep time was 234.7 minutes.  The patient spent 1.92% of the night in stage N1 sleep, 62.93% in stage N2 sleep, 22.37% in stage N3 and 12.78% in REM.Stage REM latency was prolonged at 168.0 minutes  Wake  after sleep onset was 116.0. Alpha intrusion was absent. Supine sleep was 71.35%.  CARDIAC DATA The 2 lead EKG demonstrated sinus rhythm. The mean heart rate was 56.82 beats per minute. Other EKG findings include: PVCs and bigeminal PVCs.  LEG MOVEMENT DATA The total Periodic Limb Movements of Sleep (PLMS) were 9. The PLMS index was 2.30. A PLMS index of <15 is considered normal in adults.  IMPRESSIONS The optimal PAP pressure was 5 cm of water. Central sleep apnea was not noted during this titration (CAI = 0.0/h). Mild oxygen desaturations were observed during this titration (min O2 = 91.00%). The patient snored with Soft snoring volume during this titration study. 2-lead EKG demonstrated: PVCs and bigeminal PVCs. Clinically significant periodic limb movements were not noted during this study. Arousals associated with PLMs were rare.  DIAGNOSIS Obstructive Sleep Apnea (327.23 [G47.33 ICD-10])  RECOMMENDATIONS CPAP therapy on 5 cm H2O with a Small size Fisher&Paykel Full Face Mask Simplus mask and heated humidification. Avoid alcohol, sedatives and other CNS depressants that may worsen sleep apnea and disrupt normal sleep architecture. Sleep hygiene should be reviewed to assess factors that may improve sleep quality. Rgular exercise should be initiated or continued. Return to Sleep provider for re-evaluation after 10 weeks of therapy  Signed: Armanda Magic, MD ABSM Diplomate, American Board of Sleep Medicine 11/23/2014

## 2014-11-23 NOTE — Telephone Encounter (Signed)
Pt had successful PAP titration. Please setup appointment in 10 weeks. Please let [facility] know that order for PAP is in EPIC.   

## 2014-11-25 NOTE — Telephone Encounter (Signed)
Left message to call back  

## 2014-11-25 NOTE — Telephone Encounter (Signed)
DPR on file for sone: He is aware of results.  [facility] notified.  Once she has her machine, we will schedule 10 week follow up

## 2014-12-16 ENCOUNTER — Ambulatory Visit: Payer: Medicare Other | Admitting: Pulmonary Disease

## 2014-12-24 ENCOUNTER — Ambulatory Visit: Payer: Medicare Other | Admitting: Pulmonary Disease

## 2014-12-27 ENCOUNTER — Encounter: Payer: Self-pay | Admitting: Pulmonary Disease

## 2014-12-27 ENCOUNTER — Ambulatory Visit (INDEPENDENT_AMBULATORY_CARE_PROVIDER_SITE_OTHER): Payer: Medicare Other | Admitting: Pulmonary Disease

## 2014-12-27 VITALS — BP 124/70 | HR 63 | Temp 97.8°F | Wt 144.6 lb

## 2014-12-27 DIAGNOSIS — I340 Nonrheumatic mitral (valve) insufficiency: Secondary | ICD-10-CM

## 2014-12-27 DIAGNOSIS — R911 Solitary pulmonary nodule: Secondary | ICD-10-CM

## 2014-12-27 DIAGNOSIS — I10 Essential (primary) hypertension: Secondary | ICD-10-CM

## 2014-12-27 DIAGNOSIS — I270 Primary pulmonary hypertension: Secondary | ICD-10-CM

## 2014-12-27 DIAGNOSIS — G4733 Obstructive sleep apnea (adult) (pediatric): Secondary | ICD-10-CM

## 2014-12-27 DIAGNOSIS — I38 Endocarditis, valve unspecified: Secondary | ICD-10-CM | POA: Diagnosis not present

## 2014-12-27 DIAGNOSIS — I272 Pulmonary hypertension, unspecified: Secondary | ICD-10-CM

## 2014-12-27 NOTE — Patient Instructions (Signed)
Today we updated your med list in our EPIC system...    Continue your current medications the same...  We will arrange for a follow up appt w/ your cardiologist Dr. Mendel Ryder...  Call for any questions...  Let's plan a follow up visit in 3-4 months, sooner if needed for problems.Marland KitchenMarland Kitchen

## 2015-01-15 ENCOUNTER — Encounter: Payer: Self-pay | Admitting: Pulmonary Disease

## 2015-01-15 NOTE — Progress Notes (Signed)
Subjective:     Patient ID: Jordan Barker, female   DOB: December 13, 1918, 81 y.o.   MRN: [id number]  HPI ~  October 16, 2014:  Initial pulmonary consult w/ SN>>      78 y/o WF, referred by Dr. Garnette Scheuermann (CARDS), for a pulmonary evaluation due to pulmonary nodule, pulmHTN, and hypoxemia>>  Her PCP is Dr. Ardyth Gal in [place] & I have reviewed the notes scanned into EPIC from [facility] physicians- Amparo Bristol, [facility], & DrMcGukln (Cards)...  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    Available data reveals hx of progressive dyspnea w/ chest pressure & some pedal edema, no cough/ sputum/ hemoptysis => Adm HPR 05/2013 w/ hypoxemic RF, HBP, Valvular heart dis w/ modMR & mod to severeTR, DiastolicCHF (BNP was 8500), & PulmHTN (PASP~est60);  She was started on Oxygen, diuresed, & improved;  She had Cards f/u 07/03/14 w/ DrMcGukln in [place]> much improved w/ BP= 122/62 & w/o right heart failure signs or symptoms, asked to decr Lasix to prn edema or wt gain, continue the home O2...  [facility]cho at [facility] 06/03/14 showed mod LVH w/ norm LVF (EF=65-70%), no mention of diastolic dysfunction, mild to modMR, mod to severeTR, LA&RA enlargement, norm RV size & function w/ est RVSP=9mmHg...     She saw Dr. Garnette Scheuermann for a cardiac second opinion 08/13/14> he felt that her mod pulmHTN was likely due to her DiastolicCHF, modMR, mod to severe TR, acute on chr diastolicCHF  (He noted no prev hx DVT, PE, OSA, connective tissue disorders, or use of diet pills);  He did further evaluation including>   EKG 08/13/14 showed SBrady, rate56, otherw norm EKG...   CT Angio Chest  08/14/14 showed > NEG for PE, mild cardiomegaly & atherosclerotic calcif in coronaries/ arch/ branch vessels, biapical pleuroparenchymal scarring + scattered scarring/atx, 6mm RLL pulm nodule, ?mosaic attenuation/ ?vasc attenuation mentioned by radiologist...   Sleep Study done 08/13/14 ([facility])> AHI=14 events per Hr (most in REM where the AHI=50 & all while supine); severe O2 desat down to 73%, few limb movements; Dr. Armanda Magic has been consulted & ordered  CPAP titration test.  EXAM reveals Afeb, VSS, O2sat=94% on O2 at 2L/min pulse dose;  HEENT- neg, mallampati1;  Chest- clear w/o w/r/r;  Heart- rr, gr1/6SEM w/o r/g;  Abd- soft, neg;  Ext- trace edema, no c/c...  CXR 10/16/14 showed heart at upper lim of norm in size, COPD/ clear lungs (nodule in RLL is not vis on CXR), NAD, +DJD in spine...   Spirometry 10/16/14 showed FVC=1.26 (60%), FEV1=0.70 (50%), %1sec=56, mid-flows are reduced at 43% predicted; suspect combined obstructive & restrictive pattern, consider Full PFTs at a later date...   Ambulatory oxygen saturation test 10/16/14 on O2 at 2L/min pulse dose>  O2sat=94% on 2L/min pulse at rest w/ HR=78/min; she ambulated 2 Laps on her O2 w/ lowest O2sat=90% w/ HR=86/min...  LABS 6/16:  Chems- ok x HCO3=35, BUN=16, Cr=1.21, BNP=906;  CBC- Hg=9.6, MCV=93.7, Plat=130K, Sed=23;  TSH=2.04;  QuantGold= POSITIVE  Further Labs w/ Fe=99 (41%sat), Ferritin=170, B12=634, MM panel showed norm Ig's but sl restricted mobility in  IgG & Kappa lanes- suggest repeat in 523mo...   IMP/PLAN>>  Isaura's symptoms would seem to place her in WHO Group 3, & the likely etiology of her pulmHTN appears multifactorial w/ cardiac dis, OSA, poss combined pulmonary dis all playing a roll;  Currently she is on O2 at 2L/min & needs to continue this;  Cardiac problems- HBP, diastolicCHF w/ edema, valvular heart dis, and extensive coronary calcif is being managed and followed by DrSmith;  She has some mild  edema and BNP=906 on her Lasix20, so we discussed adding DIAMOX250mg /d for the next month w/ recheck;  OSA is being managed and followed by DrTurner;  Additionally she has a mod anemia and we will bring her back for an anemia work up to r/o other process here...  ~  November 12, 2014:  23mo ROV and Jeliyah has lost 5# on the combination of Lasix20 & Diamox250; she states that he breathing is about the same- no real change noted (walking is ok indoors but notes same SOB when walking outside), she denies CP/ pressure but is c/o some back discomfort in upper back which she blames on slumping posture... 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    6mm RLL pulmonary nodule on CT Chest>  This is asymptomatic and incidental, we will plan f/u CT in 6 months.    Pulmonary HTN w/ PASP~60 by 2DEcho in [place] 2/16> on O2 at 2L/min 24/7; this is most likely multifactorial secondary to valvular heart dis, diastolic CHF, OSA, combined obstructive & restrictive lung dis...     Hypoxemia> treated w/ home O2 at 2L/min 24/7...    OSA> sleep study done by DrSmith/ DrTurner & CPAP titration study pending; she needs to get started on CPAP ASAP w/ documentation of efficacy & no nocturnal hypoxemia as part of her PulmHTN protocol...    Pos Quantiferon Gold> w/ hx remote Tb exposure, NEG CXR- no active dis...    Extensive coronary calcifications> on ASA81 and followed by DrHSmith...    Valvular heart disease w/ modMR & TR> followed by DrHSmith for Cards...    Diastolic CHF> on [id number], Lisin40, Cardura2Bid, Lasix20, Diamox250; Labs today showed BUN=22, Cr=1.32, K=4.2, BNP=866    HBP> controlled on meds above & well contrlled w/ BP= 136/60 today, denies CP/ palpit/ edema...    Anemia> we did prelim eval w/ Hg=9.6, MCV=94, Sed=23, Fe=99  (41%sat), Ferritin=170, B12=634, SPE/IEP showed no M-spike, but area of slightly restricted mobility in the IgG and Kappa lanes noted- plan repeat in 523mo EXAM reveals Afeb, VSS, O2sat=90% on O2 at 2L/min pulse dose;  HEENT- neg, mallampati1;  Chest- clear w/o w/r/r;  Heart- rr, gr1/6SEM w/o r/g;  Abd- soft, neg;  Ext- trace edema, no c/c...  LABS 7/16:  Chems- ok w/ BUN=22, Cr=1.32, K=4.2, but BNP=866 (prev 906) IMP/PLAN>>  She will continue the O2 at 2L/min 24/7;  I am reluctant to push the diuretics any further w/ Cr=1.32 but BNP remains elev;  We will add ANORO one puff daily as a trial;  She needs ROV w/ Cards- DrHSmith & has yet to see DrTurner for her OSA- CPAP titration test pending;  Ultimately when all treatments in place & optimized they will want to recheck 2DEcho to see if PASP has improved, otherw  might be a candidate for RHC & PH therapy despite her age...   ~  September 9. 2016:  6wk ROV & when last seen we added ANORO one inhalation daily but she states that she "didn't like it" and only used it once or twice then through out the sample "it's not worth it to me";  She hasn't yet followed up w/ her Cardiologist- DrHSmith, & hasn't seen her Sleep doctor- DrTTurner yet despite being on CPAP for the last 3wks she says...    Combined obstructive & restrictive lung dis by spirometry, she is 81 y/o>  Her exam is clear & I doubt any benefit from inhalers and she refused the [facility] trial...    6mm RLL pulmonary nodule on CT Chest>  This is asymptomatic and incidental, we will plan f/u CT at her next visit...    Pulmonary HTN w/ PASP~60 by 2DEcho in [place] 2/16> on O2 at 2L/min 24/7; this is most likely multifactorial secondary to valvular heart dis, diastolic CHF, OSA, combined obstructive & restrictive lung dis...     Hypoxemia> treated w/ home O2 at 2L/min 24/7...    OSA> sleep study done by DrSmith/ DrTurner & CPAP titration study completed & placed on low dose CPAP~6cmH2O => she hasn't seen DrTurner  yet but was started on CPAP as part of her PulmHTN protocol...    Pos Quantiferon Gold> w/ hx remote Tb exposure, NEG CXR- no active dis... EXAM reveals Afeb, VSS, O2sat=97% on O2 at 2L/min pulse dose;  HEENT- neg, mallampati1;  Chest- clear w/o w/r/r;  Heart- rr, gr1/6SEM w/o r/g;  Abd- soft, neg;  Ext- 1+edema, no c/c... We reviewed prob list, meds, xrays and labs>> IMP/PLAN>>  She needs f/u w/ her cardiologist & sleep med; we will plan recheck in 3-4 month w/ repeat CT Chest to f/u on nodule at that time...    Past Medical History  Diagnosis Date  . On home oxygen therapy >> on Home O2 at 2L/min continuous... 08/13/2014    Commencing 05/2014   . Moderate to severe pulmonary hypertension >> on Oxygen, CPAP Qhs, Lasix/Diamox 08/13/2014    Echo 05/2014 PASP 61 mmHg   . Moderate mitral regurgitation 08/13/2014    Echo 2016   . Essential hypertension >> on Metop50-1/2Bid, Lisin40, Cardura2, Lasix20/ Diamox250 08/13/2014  . CHF (congestive heart failure)         --bilat LE edema & fatigue    GERD >. on Prilosec20     Vit D Deficiency >> on VitD 50K weekly     No past surgical history on file.  S/P Appendectomy & Hernia repair...   Outpatient Encounter Prescriptions as of 12/27/2014  Medication Sig  . acetaZOLAMIDE (DIAMOX) 250 MG tablet Take 1 tablet (250 mg total) by mouth daily.  Marland Kitchen aspirin 81 MG tablet Take 81 mg by mouth daily.  . beta carotene w/minerals (OCUVITE) tablet Take 1 tablet by mouth daily.  Marland Kitchen doxazosin (CARDURA) 2 MG tablet Take 1 mg by mouth 2 (two) times daily.   . fluticasone (FLONASE) 50 MCG/ACT nasal spray 1 spray by Each Nare route daily.  . furosemide (LASIX) 20 MG tablet Take 1 tablet (20 mg total) by mouth daily.  Marland Kitchen lisinopril (PRINIVIL,ZESTRIL) 40 MG tablet Take 40 mg by mouth daily.  . metoprolol (LOPRESSOR) 50 MG tablet Take 25 mg by mouth 2 (two) times daily.  Marland Kitchen omeprazole (PRILOSEC) 20 MG capsule Take 20 mg by mouth daily.  . OXYGEN 2L/MIN BY CANNULA  .  Vitamin D, Ergocalciferol, (DRISDOL) 50000 UNITS CAPS capsule Take 50,000 Units by mouth once a week. MONDAYS  . Vitamin Mixture (VITAMIN E COMPLETE) CAPS Take 1 capsule by mouth daily as needed (DIZZINESS).   No facility-administered encounter medications on file as of 12/27/2014.    Allergies  Allergen Reactions  . Amlodipine Shortness Of Breath and Other (See Comments)    REACTION: "hot/cold flashes"  . Celecoxib Anxiety and Rash  . Chlorthalidone Anxiety and Rash  . Hydrochlorothiazide Anxiety and Rash  . Sulfa Antibiotics Other (See Comments)    REACTION: "UNKNOWN...BEEN SO LONG AGO"    Current Medications, Allergies, Past Medical History, Past Surgical History, Family History, and Social History were reviewed in [employer] record.   Review of Systems            All symptoms NEG except where BOLDED >>  Constitutional:  F/C/S, fatigue, anorexia, unexpected weight change. HEENT:  HA, visual changes, hearing loss, earache, nasal symptoms, sore throat, mouth sores, hoarseness. Resp:  cough, sputum, hemoptysis; SOB, tightness, wheezing. Cardio:  CP, palpit, DOE, orthopnea, edema. GI:  N/V/D/C, blood in stool; reflux, abd pain, distention, gas. GU:  dysuria, freq, urgency, hematuria, flank pain, voiding difficulty. MS:  joint pain, swelling, tenderness, decr ROM; neck pain, back pain, etc. Neuro:  HA, tremors, seizures, dizziness, syncope, weakness, numbness, gait abn. Skin:  suspicious lesions or skin rash. Heme:  adenopathy, bruising, bleeding. Psyche:  confusion, agitation, sleep disturbance, hallucinations, anxiety, depression suicidal.   Objective:   Physical Exam      Vital Signs:  Reviewed...  General:  WD, WN, 81 y/o WF in NAD, chr ill appearing; alert & oriented; pleasant & cooperative... HEENT:  [place]/AT; Conjunctiva- pink, Sclera- nonicteric, EOM-wnl, PERRLA, EACs-clear, TMs-wnl; NOSE-clear; THROAT-clear & wnl. Neck:  Supple w/ fair ROM; no JVD;  normal carotid impulses w/o bruits; no thyromegaly or nodules palpated; no lymphadenopathy. Chest:  Clear to P & A; without wheezes, rales, or rhonchi heard. Heart:  Regular Rhythm; without murmurs, rubs, or gallops detected. Abdomen:  Soft & nontender- no guarding or rebound; normal bowel sounds; no organomegaly or masses palpated. Ext:  decrROM; +arthritic changes; no varicose veins, +venous insuffic, tr edema;  Pulses intact w/o bruits. Neuro:  No focal neuro deficits; gait normal & balance OK. Derm:  No lesions noted; no rash etc. Lymph:  No cervical, supraclavicular, axillary, or inguinal adenopathy palpated.   Assessment:      IMP >>  21 y/o woman w/     Combined obstructive & restrictive lung dis> see spirometry, remember she is 81 y/o, we will consider f/u w/ FullPFTs later; she refused Anoro trial...    6mm RLL pulmonary nodule> we will plan f/u CT in 6 months    Pulmonary HTN w/ PASP~60 by 2DEcho in [place] 2/16> likely secondary to valvular heart dis, diastolic CHF, OSA, combined obstructive & restrictive lung dis...     Hypoxemia> treated w/ home O2 at 2L/min 24/7...    Pos Quantiferon Gold w/ hx remote Tb exposure, NEG CXR- no active dis...    Anemia-- Hg=9.6 (?ACD) w/ norm Fe/ B12/ and neg SPE/IEP so far...  Sleep Apnea evaluated & treated by DrTTurner:     OSA> sleep study done by DrSmith/ DrTurner & CPAP titration study pending- needs to get started on the CPAP to optimize her treatments...  Cardiac eval and treatment per DrHSmith:     HBP> controlled on meds- ASA81, Metop25Bid, Lisin40, Cardura2Bid, Lasix20, Diamox250    Extensive  coronary calcifications>    Valvular heart disease w/ modMR & TR>        Diastolic CHF>    Pulmonary HTN w/ PASP~60 by 2DEcho in [place] 2/16> likely secondary to valvular heart dis, diastolic CHF, OSA, combined obstructive & restrictive lung dis...   PLAN >>  6/29>  Jaquelyn's symptoms would seem to place her in [facility] Group 3 & the likely etiology of her  pulmHTN appears multifactorial w/ cardiac dis, OSA, poss combined pulmonary dis all playing a roll;  Currently she is on O2 at 2L/min & needs to continue this;  Cardiac problems- HBP, diastolicCHF w/ edema, valvular heart dis, and extensive coronary calcif is being managed and followed by DrSmith;  OSA is being managed and followed by DrTurner;  I am reluctant to push the diuretics any further w/ Cr=1.32 but BNP remains elev;  We will add ANORO one puff daily as a trial;  She needs ROV w/ Cards- DrHSmith & has yet to see DrTurner for her OSA- CPAP titration test pending;  Ultimately when all treatments in place & optimized they will want to recheck 2DEcho to see if PASP has improved, otherw might be a candidate for RHC & PH therapy despite her age. 7/26>  She will continue the O2 at 2L/min 24/7;  I am reluctant to push the diuretics any further w/ Cr=1.32 but BNP remains elev;  We will add ANORO one puff daily as a trial;  She needs ROV w/ Cards- DrHSmith & has yet to see DrTurner for her OSA- CPAP titration test pending;  Ultimately when all treatments in place & optimized they will want to recheck 2DEcho to see if PASP has improved, otherw might be a candidate for RHC & PH therapy despite her age. 9/9>  She needs f/u w/ her cardiologist & sleep med; we will plan recheck in 3-4 month w/ repeat CT Chest to f/u on nodule at that time     Plan:     Patient's Medications  New Prescriptions   No medications on file  Previous Medications   ACETAZOLAMIDE (DIAMOX) 250 MG TABLET    Take 1 tablet (250 mg total) by mouth daily.   ASPIRIN 81 MG TABLET    Take 81 mg by mouth daily.   BETA CAROTENE W/MINERALS (OCUVITE) TABLET    Take 1 tablet by mouth daily.   DOXAZOSIN (CARDURA) 2 MG TABLET    Take 1 mg by mouth 2 (two) times daily.    FLUTICASONE (FLONASE) 50 MCG/ACT NASAL SPRAY    1 spray by Each Nare route daily.   FUROSEMIDE (LASIX) 20 MG TABLET    Take 1 tablet (20 mg total) by mouth daily.   LISINOPRIL  (PRINIVIL,ZESTRIL) 40 MG TABLET    Take 40 mg by mouth daily.   METOPROLOL (LOPRESSOR) 50 MG TABLET    Take 25 mg by mouth 2 (two) times daily.   OMEPRAZOLE (PRILOSEC) 20 MG CAPSULE    Take 20 mg by mouth daily.   OXYGEN    2L/MIN BY CANNULA   VITAMIN D, ERGOCALCIFEROL, (DRISDOL) 50000 UNITS CAPS CAPSULE    Take 50,000 Units by mouth once a week. MONDAYS   VITAMIN MIXTURE (VITAMIN E COMPLETE) CAPS    Take 1 capsule by mouth daily as needed (DIZZINESS).  Modified Medications   No medications on file  Discontinued Medications   No medications on file

## 2015-01-17 ENCOUNTER — Telehealth: Payer: Self-pay

## 2015-01-17 NOTE — Telephone Encounter (Signed)
Cardiac clearance place in MR nurse fax box to be faxed to [facility] eye

## 2015-01-20 ENCOUNTER — Encounter: Payer: Self-pay | Admitting: Physician Assistant

## 2015-01-20 DIAGNOSIS — N183 Chronic kidney disease, stage 3 unspecified: Secondary | ICD-10-CM | POA: Insufficient documentation

## 2015-01-20 DIAGNOSIS — I5032 Chronic diastolic (congestive) heart failure: Secondary | ICD-10-CM | POA: Insufficient documentation

## 2015-01-20 DIAGNOSIS — I071 Rheumatic tricuspid insufficiency: Secondary | ICD-10-CM | POA: Insufficient documentation

## 2015-01-20 DIAGNOSIS — J9610 Chronic respiratory failure, unspecified whether with hypoxia or hypercapnia: Secondary | ICD-10-CM | POA: Insufficient documentation

## 2015-01-20 DIAGNOSIS — J849 Interstitial pulmonary disease, unspecified: Secondary | ICD-10-CM | POA: Insufficient documentation

## 2015-01-20 DIAGNOSIS — I340 Nonrheumatic mitral (valve) insufficiency: Secondary | ICD-10-CM | POA: Insufficient documentation

## 2015-01-20 NOTE — Progress Notes (Signed)
Cardiology Office Note Date:  01/21/2015  Patient ID:  Jordan Barker, Jordan Barker [birth year], MRN [id number] PCP:  Ardyth Gal, MD  Cardiologist:  Dr. Verdis Prime  Chief Complaint: f/u pulm HTN  History of Present Illness: Jordan Barker is a 81 y.o. female with history of OSA, mod-severe pulm HTN, valvular heart disease (mild-mod MR, mod-severe TR by echo 05/2014), hypoxia, pulm nodule, probable interstitial lung disease and pulm nodule (followed by pulm), probable CKD based on historical labs who presents for follow-up.  2D echo 05/2014: mild mitral annular calcification, mild-mod MR, mod-severe TR, pulm HTN suggested by TR velocity, normal LV structure and systolic function, dilated IVC. CTA 07/2014: no acute PE, right hilar adenopathy, 6mm pulm nodule (recommend f/u 6-12), extensive coronary calcification, calcified mitral valve, mosaic attenuation in the lungs suggesting obstructive bronchiolitis or (less likely) residual from chronic PE. She saw Dr. Kriste Basque earlier this month who felt pulm HTN was likely multifactorial due to valvular heart disease, diastolic CHF, OSA, combined obstructive & restrictive lung disease. He started her on an inhaler. Titration of her CPAP was recommended. Consideration of f/u echo once these measures were in place was mentioned.  She comes in today for follow-up. She denies any chest pain. Overall she says she is doing "well" but does endorse chronic fatigue and dyspnea. She says the biggest hassle is carrying around her home oxygen unit. She wishes she could go off it. She stopped taking her inhaler as prescribed because she was afraid of the side effects and didn't feel like it was making a big difference. No LEE, weight gain, syncope or orthopnea.  Past Medical History  Diagnosis Date  . On home oxygen therapy     Commencing 05/2014   . Moderate to severe pulmonary hypertension (HCC)     Echo 05/2014 PASP 61 mmHg   . Essential hypertension   . Chronic diastolic CHF (congestive  heart failure) (HCC)   . OSA (obstructive sleep apnea)   . Mitral regurgitation     a. Echo 05/2014 - mild-mod MR.  . Tricuspid regurgitation     a. Echo 05/2014 - mod-sev TR.  Marland Kitchen Pulmonary nodule     a. Followed by pulm.  . Chronic respiratory failure (HCC)   . Interstitial lung disease (HCC)   . CKD (chronic kidney disease), stage III     No past surgical history on file.  Current Outpatient Prescriptions  Medication Sig Dispense Refill  . metoprolol succinate (TOPROL-XL) 100 MG 24 hr tablet Take 0.5 tablets by mouth 2 (two) times daily.    Marland Kitchen acetaZOLAMIDE (DIAMOX) 250 MG tablet Take 1 tablet (250 mg total) by mouth daily. 30 tablet 2  . amoxicillin (AMOXIL) 500 MG capsule Take for  7 days as directed  0  . aspirin 81 MG tablet Take 81 mg by mouth daily as needed (pain).     Marland Kitchen beta carotene w/minerals (OCUVITE) tablet Take 1 tablet by mouth daily.    Marland Kitchen doxazosin (CARDURA) 2 MG tablet Take 1 mg by mouth 2 (two) times daily.     . furosemide (LASIX) 20 MG tablet Take 1 tablet (20 mg total) by mouth daily. 30 tablet 6  . lisinopril (PRINIVIL,ZESTRIL) 40 MG tablet Take 40 mg by mouth daily.    Marland Kitchen omeprazole (PRILOSEC) 20 MG capsule Take 20 mg by mouth daily.    . OXYGEN as directed. 2L/MIN BY CANNULA    . Vitamin D, Ergocalciferol, (DRISDOL) 50000 UNITS CAPS capsule Take 50,000 Units by mouth once  a week. MONDAYS    . Vitamin Mixture (VITAMIN E COMPLETE) CAPS Take 1 capsule by mouth daily as needed (DIZZINESS).     No current facility-administered medications for this visit.    Allergies:   Amlodipine; Celecoxib; Chlorthalidone; Hydrochlorothiazide; and Sulfa antibiotics   Social History:  The patient  reports that she quit smoking about 68 years ago. Her smoking use included Cigarettes. She has a 1 pack-year smoking history. She does not have any smokeless tobacco history on file.   Family History:  The patient's family history includes Bone cancer in her brother; Cancer in her  brother; Congestive Heart Failure in her brother; Heart disease in her brother and brother; Hypertension in her brother, father, and mother; Ovarian cancer in her mother; Parkinson's disease in her brother and father; Prostate cancer in her brother.   ROS:  Please see the history of present illness.   All other systems are reviewed and otherwise negative.   PHYSICAL EXAM:  VS:  BP 138/62 mmHg  Pulse 62  Ht  (1.626 m)  Wt 144 lb 1.9 oz (65.372 kg)  BMI 24.73 kg/m2  SpO2 99% BMI: Body mass index is 24.73 kg/(m^2). Well developed thin WF, in no acute distress HEENT: normocephalic, atraumatic Neck: no JVD, carotid bruits or masses Cardiac:  normal S1, S2; RRR; no murmurs, rubs, or gallops Lungs:  clear to auscultation bilaterally, no wheezing, rhonchi or rales Abd: soft, nontender, no hepatomegaly, + BS MS: no deformity or atrophy Ext: no edema; stocking hose in place Skin: warm and dry, no rash Neuro:  moves all extremities spontaneously, no focal abnormalities noted, follows commands Psych: euthymic mood, full affect  EKG:  Done today shows NSR 62bpm 1st degree AVB, TWI avL, otherwise no acute changes  Recent Labs: 10/16/2014: ALT 11; Hemoglobin 9.6*; Platelets 130.0*; TSH 2.04 11/12/2014: BUN 22; Creatinine, Ser 1.32*; Potassium 4.2; Pro B Natriuretic peptide (BNP) 866.0*; Sodium 140  No results found for requested labs within last 365 days.   CrCl cannot be calculated (Patient has no serum creatinine result on file.).   Wt Readings from Last 3 Encounters:  01/21/15 144 lb 1.9 oz (65.372 kg)  12/27/14 144 lb 9.6 oz (65.59 kg)  11/12/14 141 lb 6.4 oz (64.139 kg)     Other studies reviewed: Additional studies/records reviewed today include: summarized above  ASSESSMENT AND PLAN:  1. Mod-severe pulm HTN - agree likely multifactorial from longstanding untreated OSA, chronic pulmonary disease and possible component of diastolic CHF/valvular heart disease. She does not appear  volume overloaded on exam at present time. I discussed her case in depth with Dr. Katrinka Blazing. At this time he recommends a more conservative approach, which includes titration of CPAP for OSA nd optimization of pulmonary disease. He does not recommend pursuing RHC or further diagnostics at this time. We may consider f/u echo to reassess PASP down the road.  2. Chronic diastolic CHF - appears euvolemic. Weight stable. Salt restriction advised.  3. CKD stage III - baseline Cr appears ~1.2-1.3.  4. Essential HTN - trend of BPs running normotensive recently. Continue current regimen. 5. Valvular heart disease with MR/TR as above - follow clinically. 6. OSA - patient states she was due to titration appointment. We will look into this today to make sure it is arranged.  Disposition: F/u with Dr. Katrinka Blazing in 4 months.  Current medicines are reviewed at length with the patient today.  The patient did not have any concerns regarding medicines.  Signed, Ronie Spies  PA-C 01/21/2015 9:30 AM     [facility] [address] [place] [address] [phone] (office)  [phone] (fax)

## 2015-01-21 ENCOUNTER — Ambulatory Visit (INDEPENDENT_AMBULATORY_CARE_PROVIDER_SITE_OTHER): Payer: Medicare Other | Admitting: Physician Assistant

## 2015-01-21 ENCOUNTER — Encounter: Payer: Self-pay | Admitting: Physician Assistant

## 2015-01-21 VITALS — BP 138/62 | HR 62 | Ht 64.0 in | Wt 144.1 lb

## 2015-01-21 DIAGNOSIS — N183 Chronic kidney disease, stage 3 unspecified: Secondary | ICD-10-CM

## 2015-01-21 DIAGNOSIS — I5032 Chronic diastolic (congestive) heart failure: Secondary | ICD-10-CM | POA: Diagnosis not present

## 2015-01-21 DIAGNOSIS — I272 Other secondary pulmonary hypertension: Secondary | ICD-10-CM

## 2015-01-21 DIAGNOSIS — I071 Rheumatic tricuspid insufficiency: Secondary | ICD-10-CM

## 2015-01-21 DIAGNOSIS — I10 Essential (primary) hypertension: Secondary | ICD-10-CM

## 2015-01-21 DIAGNOSIS — G4733 Obstructive sleep apnea (adult) (pediatric): Secondary | ICD-10-CM

## 2015-01-21 DIAGNOSIS — I340 Nonrheumatic mitral (valve) insufficiency: Secondary | ICD-10-CM

## 2015-01-21 NOTE — Patient Instructions (Signed)
Medication Instructions:   Your physician recommends that you continue on your current medications as directed. Please refer to the Current Medication list given to you today.   Labwork: NONE ORDER TODAY  Testing/Procedures:  NONE ORDER TODAY  Follow-Up:  Your physician wants you to follow-up in:  IN  4  MONTHS WITH DR Marlou Starks will receive a reminder letter in the mail two months in advance. If you don't receive a letter, please call our office to schedule the follow-up appointment.     Any Other Special Instructions Will Be Listed Below (If Applicable).

## 2015-01-23 ENCOUNTER — Telehealth: Payer: Self-pay | Admitting: *Deleted

## 2015-01-23 NOTE — Telephone Encounter (Signed)
Left message for patient to call to schedule follow-up with Dr. Mayford Knife. Patient needs to be seen BEFORE March 03, 2015 for insurance purpose.

## 2015-01-31 ENCOUNTER — Encounter: Payer: Self-pay | Admitting: Cardiology

## 2015-02-05 ENCOUNTER — Ambulatory Visit (INDEPENDENT_AMBULATORY_CARE_PROVIDER_SITE_OTHER): Payer: Medicare Other | Admitting: Cardiology

## 2015-02-05 ENCOUNTER — Encounter: Payer: Self-pay | Admitting: Cardiology

## 2015-02-05 VITALS — BP 126/56 | HR 63 | Ht 64.0 in | Wt 145.0 lb

## 2015-02-05 DIAGNOSIS — G4733 Obstructive sleep apnea (adult) (pediatric): Secondary | ICD-10-CM

## 2015-02-05 DIAGNOSIS — I272 Other secondary pulmonary hypertension: Secondary | ICD-10-CM

## 2015-02-05 DIAGNOSIS — I10 Essential (primary) hypertension: Secondary | ICD-10-CM | POA: Diagnosis not present

## 2015-02-05 NOTE — Patient Instructions (Signed)

## 2015-02-05 NOTE — Progress Notes (Signed)
Cardiology Office Note   Date:  02/05/2015   ID:  Jordan Barker, DOB Oct 07, 1918, MRN [id number]  PCP:  Ardyth Gal, MD    No chief complaint on file.     History of Present Illness: Jordan Barker is a 81 y.o. female who presents for evaluation of sleep apnea.  She was referred for PSG due to pulmonary HTN and excessive daytime sleepiness with an Epworth sleepiness sore of 11.  She underwent PSG showing mild to moderate OSA with an AHI of 14.4/hr with all events occurring in the supine position during REM sleep.  She had severe oxygen desaturations as low as 73% with respiratory events.  She underwent CPAP titration to 5cm H2O.  SHe now presents for followup.  She is doing well with her CPAP device.  She tolerates her full face mask and feels the pressure is adequate.  Since starting CPAP she has noticed improvement in daytime sleepiness and feels more rested in the am if she has slept well the night before.  .      Past Medical History  Diagnosis Date  . On home oxygen therapy     Commencing 05/2014   . Moderate to severe pulmonary hypertension (HCC)     Echo 05/2014 PASP 61 mmHg   . Essential hypertension   . Chronic diastolic CHF (congestive heart failure) (HCC)   . OSA (obstructive sleep apnea)   . Mitral regurgitation     a. Echo 05/2014 - mild-mod MR.  . Tricuspid regurgitation     a. Echo 05/2014 - mod-sev TR.  Marland Kitchen Pulmonary nodule     a. Followed by pulm.  . Chronic respiratory failure (HCC)   . Interstitial lung disease (HCC)   . CKD (chronic kidney disease), stage III     No past surgical history on file.   Current Outpatient Prescriptions  Medication Sig Dispense Refill  . acetaZOLAMIDE (DIAMOX) 250 MG tablet Take 1 tablet (250 mg total) by mouth daily. 30 tablet 2  . amoxicillin (AMOXIL) 500 MG capsule Take for  7 days as directed  0  . aspirin 81 MG tablet Take 81 mg by mouth daily as needed (pain).     Marland Kitchen beta carotene w/minerals (OCUVITE) tablet Take  1 tablet by mouth daily.    Marland Kitchen doxazosin (CARDURA) 2 MG tablet Take 1 mg by mouth 2 (two) times daily.     . furosemide (LASIX) 20 MG tablet Take 1 tablet (20 mg total) by mouth daily. 30 tablet 6  . lisinopril (PRINIVIL,ZESTRIL) 40 MG tablet Take 40 mg by mouth daily.    . metoprolol succinate (TOPROL-XL) 100 MG 24 hr tablet Take 0.5 tablets by mouth 2 (two) times daily.    Marland Kitchen omeprazole (PRILOSEC) 20 MG capsule Take 20 mg by mouth daily.    . OXYGEN as directed. 2L/MIN BY CANNULA    . Vitamin D, Ergocalciferol, (DRISDOL) 50000 UNITS CAPS capsule Take 50,000 Units by mouth once a week. MONDAYS    . Vitamin Mixture (VITAMIN E COMPLETE) CAPS Take 1 capsule by mouth daily as needed (DIZZINESS).     No current facility-administered medications for this visit.    Allergies:   Amlodipine; Celecoxib; Chlorthalidone; Hydrochlorothiazide; and Sulfa antibiotics    Social History:  The patient  reports that she quit smoking about 68 years ago. Her smoking use included Cigarettes. She has a 1 pack-year smoking history. She does  not have any smokeless tobacco history on file.   Family History:  The patient's family history includes Bone cancer in her brother; Cancer in her brother; Congestive Heart Failure in her brother; Heart disease in her brother and brother; Hypertension in her brother, father, and mother; Ovarian cancer in her mother; Parkinson's disease in her brother and father; Prostate cancer in her brother.    ROS:  Please see the history of present illness.   Otherwise, review of systems are positive for none.   All other systems are reviewed and negative.    PHYSICAL EXAM: VS:  BP 126/56 mmHg  Pulse 63  Ht 5\' 4"  (1.626 m)  Wt 145 lb (65.772 kg)  BMI 24.88 kg/m2  SpO2 98% , BMI Body mass index is 24.88 kg/(m^2). GEN: Well nourished, well developed, in no acute distress HEENT: normal Neck: no JVD, carotid bruits, or masses Cardiac: RRR; no murmurs, rubs, or gallops,no edema    Respiratory:  clear to auscultation bilaterally, normal work of breathing GI: soft, nontender, nondistended, + BS MS: no deformity or atrophy Skin: warm and dry, no rash Neuro:  Strength and sensation are intact Psych: euthymic mood, full affect   EKG:  EKG is not ordered today.    Recent Labs: 10/16/2014: ALT 11; Hemoglobin 9.6*; Platelets 130.0*; TSH 2.04 11/12/2014: BUN 22; Creatinine, Ser 1.32*; Potassium 4.2; Pro B Natriuretic peptide (BNP) 866.0*; Sodium 140    Lipid Panel No results found for: CHOL, TRIG, HDL, CHOLHDL, VLDL, LDLCALC, LDLDIRECT    Wt Readings from Last 3 Encounters:  02/05/15 145 lb (65.772 kg)  01/21/15 144 lb 1.9 oz (65.372 kg)  12/27/14 144 lb 9.6 oz (65.59 kg)       ASSESSMENT AND PLAN:  1.  Mild to moderate OSA with an AHI of 14/hr now on CPAP at 5cm H2o and tolerating well.  Her d/l today showed an AHI of 1.1/hr on 5cm H2O and 83% compliance in using more than 4 hours nightly.  Patient has been using and benefiting from CPAP use and will continue to benefit from therapy. She had no oxygen desaturations on CPAP and O2 on CPAP titration study. 2.  HTN- BP controlled 3.  Moderate to severe pulmonary HTN on home O2   Current medicines are reviewed at length with the patient today.  The patient does not have concerns regarding medicines.  The following changes have been made:  no change  Labs/ tests ordered today: See above Assessment and Plan No orders of the defined types were placed in this encounter.     Disposition:   FU with me in 6 months  Signed, Quintella ReichertURNER,TRACI R, MD  02/05/2015 11:08 AM    [facility] [address], [place], [place]  [address] Phone: [phone]) [phone]; Fax: [phone]

## 2015-02-08 ENCOUNTER — Other Ambulatory Visit: Payer: Self-pay | Admitting: Pulmonary Disease

## 2015-03-24 ENCOUNTER — Other Ambulatory Visit (INDEPENDENT_AMBULATORY_CARE_PROVIDER_SITE_OTHER): Payer: Medicare Other

## 2015-03-24 ENCOUNTER — Ambulatory Visit (INDEPENDENT_AMBULATORY_CARE_PROVIDER_SITE_OTHER): Payer: Medicare Other | Admitting: Pulmonary Disease

## 2015-03-24 ENCOUNTER — Encounter: Payer: Self-pay | Admitting: Pulmonary Disease

## 2015-03-24 VITALS — BP 110/72 | HR 58 | Temp 97.5°F | Wt 145.8 lb

## 2015-03-24 DIAGNOSIS — I272 Other secondary pulmonary hypertension: Secondary | ICD-10-CM | POA: Diagnosis not present

## 2015-03-24 DIAGNOSIS — I38 Endocarditis, valve unspecified: Secondary | ICD-10-CM

## 2015-03-24 DIAGNOSIS — R911 Solitary pulmonary nodule: Secondary | ICD-10-CM

## 2015-03-24 DIAGNOSIS — R0600 Dyspnea, unspecified: Secondary | ICD-10-CM

## 2015-03-24 DIAGNOSIS — I5032 Chronic diastolic (congestive) heart failure: Secondary | ICD-10-CM

## 2015-03-24 DIAGNOSIS — I340 Nonrheumatic mitral (valve) insufficiency: Secondary | ICD-10-CM | POA: Diagnosis not present

## 2015-03-24 DIAGNOSIS — J9611 Chronic respiratory failure with hypoxia: Secondary | ICD-10-CM

## 2015-03-24 LAB — BASIC METABOLIC PANEL
BUN: 19 mg/dL (ref 6–23)
CALCIUM: 9.3 mg/dL (ref 8.4–10.5)
CO2: 30 mEq/L (ref 19–32)
CREATININE: 1.27 mg/dL — AB (ref 0.40–1.20)
Chloride: 104 mEq/L (ref 96–112)
GFR: 41.56 mL/min — AB (ref >60.00)
Glucose, Bld: 100 mg/dL — ABNORMAL HIGH (ref 70–99)
Potassium: 4.4 mEq/L (ref 3.5–5.1)
Sodium: 139 mEq/L (ref 135–145)

## 2015-03-24 LAB — CBC WITH DIFFERENTIAL/PLATELET
BASOS ABS: 0 10*3/uL (ref 0.0–0.1)
BASOS PCT: 0.5 % (ref 0.0–3.0)
EOS ABS: 0.2 10*3/uL (ref 0.0–0.7)
Eosinophils Relative: 4.4 % (ref 0.0–5.0)
HEMATOCRIT: 30.2 % — AB (ref 36.0–46.0)
HEMOGLOBIN: 9.8 g/dL — AB (ref 12.0–15.0)
LYMPHS PCT: 34.9 % (ref 12.0–46.0)
Lymphs Abs: 1.6 10*3/uL (ref 0.7–4.0)
MCHC: 32.6 g/dL (ref 30.0–36.0)
MCV: 93.7 fl (ref 78.0–100.0)
MONO ABS: 0.5 10*3/uL (ref 0.1–1.0)
Monocytes Relative: 10.8 % (ref 3.0–12.0)
Neutro Abs: 2.3 10*3/uL (ref 1.4–7.7)
Neutrophils Relative %: 49.4 % (ref 43.0–77.0)
Platelets: 125 10*3/uL — ABNORMAL LOW (ref 150.0–400.0)
RBC: 3.22 Mil/uL — AB (ref 3.87–5.11)
RDW: 15 % (ref 11.5–15.5)
WBC: 4.7 10*3/uL (ref 4.0–10.5)

## 2015-03-24 LAB — BRAIN NATRIURETIC PEPTIDE: PRO B NATRI PEPTIDE: 696 pg/mL — AB (ref 0.0–100.0)

## 2015-03-24 NOTE — Patient Instructions (Signed)
Today we updated your med list in our EPIC system...    Continue your current medications the same...  Today we rechecked your blood work...    We will contact you w/ the results when available...   Call for any questions...  Let's plan a follow up visit in 26mo, sooner if needed for problems.Marland Kitchen..Marland Kitchen

## 2015-03-24 NOTE — Progress Notes (Signed)
Subjective:     Patient ID: Jordan Barker, female   DOB: Jan 28, 1919, 81 y.o.   MRN: [id number]  HPI ~  October 16, 2014:  Initial pulmonary consult w/ SN>>      18 y/o WF, referred by Dr. Garnette Scheuermann ([facility]DS), for a pulmonary evaluation due to pulmonary nodule, pulmHTN, and hypoxemia>>  Her PCP is Dr. Ardyth Gal in [place] & I have reviewed the notes scanned into EPIC from [facility] physicians- Amparo Bristol, [facility], & DrMcGukln (Cards)...  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    Available data reveals hx of progressive dyspnea w/ chest pressure & some pedal edema, no cough/ sputum/ hemoptysis => Adm HPR 05/2013 w/ hypoxemic RF, HBP, Valvular heart dis w/ modMR & mod to severeTR, DiastolicCHF (BNP was 8500), & PulmHTN (PASP~est60);  She was started on Oxygen, diuresed, & improved;  She had Cards f/u 07/03/14 w/ DrMcGukln in [place]> much improved w/ BP= 122/62 & w/o right heart failure signs or symptoms, asked to decr Lasix to prn edema or wt gain, continue the home O2...  [facility]cho [facility]14 showed mod LVH w/ norm LVF (EF=65-70%), no mention of diastolic dysfunction, mild to modMR, mod to severeTR, LA&RA enlargement, norm RV size & function w/ est RVSP=61mmHg...     She saw Dr. Garnette Scheuermann for a cardiac second opinion 08/13/14> he felt that her mod pulmHTN was likely due to her DiastolicCHF, modMR, mod to severe TR, acute on chr diastolicCHF  (He noted no prev hx DVT, PE, OSA, connective tissue disorders, or use of diet pills);  He did further evaluation including>   EKG 08/13/14 showed SBrady, rate56, otherw norm EKG...   CT Angio Chest  08/14/14 showed > NEG for PE, mild cardiomegaly & atherosclerotic calcif in coronaries/ arch/ branch vessels, biapical pleuroparenchymal scarring + scattered scarring/atx, 6mm RLL pulm nodule, ?mosaic attenuation/ ?vasc attenuation mentioned by radiologist...   [facility] AHI=14 events per Hr (most in REM where the AHI=50 & all while supine); severe O2 desat down to 73%, few limb movements; Dr. Armanda Magic has been consulted & ordered  CPAP titration test. EXAM reveals Afeb, VSS, O2sat=94% on O2 at 2L/min pulse dose;  HEENT- neg, mallampati1;  Chest- clear w/o w/r/r;  Heart- rr, gr1/6SEM w/o r/g;  Abd- soft, neg;  Ext- trace edema, no c/c...  CXR 10/16/14 showed heart at upper lim of norm in size, COPD/ clear lungs (nodule in RLL is not vis on CXR), NAD, +DJD in spine...   Spirometry 10/16/14 showed FVC=1.26 (60%), FEV1=0.70 (50%), %1sec=56, mid-flows are reduced at 43% predicted; suspect combined obstructive & restrictive pattern, consider Full PFTs at a later date...   Ambulatory oxygen saturation test 10/16/14 on O2 at 2L/min pulse dose>  O2sat=94% on 2L/min pulse at rest w/ HR=78/min; she ambulated 2 Laps on her O2 w/ lowest O2sat=90% w/ HR=86/min...  LABS 6/16:  Chems- ok x HCO3=35, BUN=16, Cr=1.21, BNP=906;  CBC- Hg=9.6, MCV=93.7, Plat=130K, Sed=23;  TSH=2.04;  QuantGold= POSITIVE  Further Labs w/ Fe=99 (41%sat), Ferritin=170, B12=634, MM panel showed norm Ig's but sl restricted mobility in IgG &  Kappa lanes- suggest repeat in 39mo...   IMP/PLAN>>  Zhana's symptoms would seem to place her in WHO Group 3, & the likely etiology of her pulmHTN appears multifactorial w/ cardiac dis, OSA, poss combined pulmonary dis all playing a roll;  Currently she is on O2 at 2L/min & needs to continue this;  Cardiac problems- HBP, diastolicCHF w/ edema, valvular heart dis, and extensive coronary calcif is being managed and followed by DrSmith;  She has some mild edema  and BNP=906 on her Lasix20, so we discussed adding DIAMOX250mg /d for the next month w/ recheck;  OSA is being managed and followed by DrTurner;  Additionally she has a mod anemia and we will bring her back for an anemia work up to r/o other process here...   ~  November 12, 2014:  48mo ROV and Mahlet has lost 5# on the combination of Lasix20 & Diamox250; she states that he breathing is about the same- no real change noted (walking is ok indoors but notes same SOB when walking outside), she denies CP/ pressure but is c/o some back discomfort in upper back which she blames on slumping posture... 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    6mm RLL pulmonary nodule on CT Chest>  This is asymptomatic and incidental, we will plan f/u CT in 6 months.    Pulmonary HTN w/ PASP~60 by 2DEcho in [place] 2/16> on O2 at 2L/min 24/7; this is most likely multifactorial secondary to valvular heart dis, diastolic CHF, OSA, combined obstructive & restrictive lung dis...     Hypoxemia> treated w/ home O2 at 2L/min 24/7...    OSA> sleep study done by DrSmith/ DrTurner & CPAP titration study pending; she needs to get started on CPAP ASAP w/ documentation of efficacy & no nocturnal hypoxemia as part of her PulmHTN protocol...    Pos Quantiferon Gold> w/ hx remote Tb exposure, NEG CXR- no active dis...    Extensive coronary calcifications> on ASA81 and followed by DrHSmith...    Valvular heart disease w/ modMR & TR> followed by DrHSmith for Cards...    Diastolic CHF> on [id number], Lisin40, Cardura2Bid, Lasix20, Diamox250; Labs today showed BUN=22, Cr=1.32, K=4.2, BNP=866    HBP> controlled on meds above & well contrlled w/ BP= 136/60 today, denies CP/ palpit/ edema...    Anemia> we did prelim eval w/ Hg=9.6, MCV=94, Sed=23, Fe=99  (41%sat), Ferritin=170, B12=634, SPE/IEP showed no M-spike, but area of slightly restricted mobility in the IgG and Kappa lanes noted- plan repeat in 39mo EXAM reveals Afeb, VSS, O2sat=90% on O2 at 2L/min pulse dose;  HEENT- neg, mallampati1;  Chest- clear w/o w/r/r;  Heart- rr, gr1/6SEM w/o r/g;  Abd- soft, neg;  Ext- trace edema, no c/c...  LABS 7/16:  Chems- ok w/ BUN=22, Cr=1.32, K=4.2, but BNP=866 (prev 906) IMP/PLAN>>  She will continue the O2 at 2L/min 24/7;  I am reluctant to push the diuretics any further w/ Cr=1.32 but BNP remains elev;  We will add ANORO one puff daily as a trial;  She needs ROV w/ Cards- DrHSmith & has yet to see DrTurner for her OSA- CPAP titration test pending;  Ultimately when all treatments in place & optimized they will want to recheck 2DEcho to see if PASP has improved, otherw might  be a candidate for RHC & PH therapy despite her age...    ~  September 9. 2016:  6wk ROV & when last seen we added ANORO one inhalation daily but she states that she "didn't like it" and only used it once or twice then through out the sample "it's not worth it to me";  She hasn't yet followed up w/ her Cardiologist- DrHSmith, & hasn't seen her Sleep doctor- DrTTurner yet despite being on CPAP for the last 3wks she says...    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    6mm RLL pulmonary nodule on CT Chest>  This is asymptomatic and incidental, we will plan f/u CT at her next visit...    Pulmonary HTN w/ PASP~60 by 2DEcho in [place] 2/16> on O2 at 2L/min 24/7; this is most likely multifactorial secondary to valvular heart dis, diastolic CHF, OSA, combined obstructive & restrictive lung dis...     Hypoxemia> treated w/ home O2 at 2L/min 24/7...    OSA> sleep study done by DrSmith/ DrTurner & CPAP titration study completed & placed on low dose CPAP~6cmH2O => she hasn't seen  DrTurner yet but was started on CPAP as part of her PulmHTN protocol...    Pos Quantiferon Gold> w/ hx remote Tb exposure, NEG CXR- no active dis... EXAM reveals Afeb, VSS, O2sat=97% on O2 at 2L/min pulse dose;  HEENT- neg, mallampati1;  Chest- clear w/o w/r/r;  Heart- rr, gr1/6SEM w/o r/g;  Abd- soft, neg;  Ext- 1+edema, no c/c... We reviewed prob list, meds, xrays and labs>> IMP/PLAN>>  She needs f/u w/ her cardiologist & sleep med; we will plan recheck in 3-4 month w/ repeat CT Chest to f/u on nodule at that time...   ~  March 24, 2015:  16mo ROV & Seriyah had her f/u w/ CARDS office PA 10/4> OSA, PulmHTN, modMR, severeTR, CKD; no CP, c/o fatigue & dyspnea, doesn't like the O2; they recommended a conservative approach in this 81 y/o woman- same meds, optimize CPAP, they did not favor RHC or specific pulmHTN meds, they will keep watch on 2DEcho...  She also saw her sleep doc DrTTurner 10/19> ESS=11, PSG showed mild to mod OSA w/ AHI=14.4/hr w/ severe O2sat to 73%, CPAP titration to 5cmH2O w/ full face mask & she reports doing satis- more rested, less sleepy; download revealed AHI=1.1/hr on 5cmH20 pressure & using it >4H/night 83% of the time...  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    6mm RLL pulmonary nodule on CT Chest>  This is asymptomatic and incidental, we will plan f/u CT later...    Pulmonary HTN w/ PASP~60 by 2DEcho in [place] 2/16> on O2 at 2L/min 24/7; this is most likely multifactorial secondary to valvular heart dis, diastolic CHF, OSA, combined obstructive & restrictive lung dis; followed by DrHSmith & they plan conservative approach & serial Echos......     Hypoxemia> treated w/ home O2 at 2L/min 24/7...    OSA> sleep study done by DrSmith/ DrTurner & CPAP titration study completed & placed on  CPAP 5cmH2O => she had f/u 01/2015 & improved...    Pos Quantiferon Gold> w/ hx remote  Tb exposure, NEG CXR- no active dis & not producing any sput...    Pancytopenia>  Prev blood work by PCP not avail for review- Labs 6/16 showed Hg=9.6, MCV=93.7, Plat=130K, Sed=23; Fe=99 (41%sat), Ferritin=170, B12=634, MM panel showed norm Ig's but sl restricted mobility in IgG & Kappa lanes- suggest repeat in 42mo => Labs 12/16 showed Hg=9.8, MCV=93.7, WBC=4.7, Plat=125K; SPE/IEP- similar findings w/ area of slightly restricted mobility in the IgG and Kappa lanes;  They are concerned & we will refer to HEME...  EXAM reveals Afeb, VSS, O2sat=96% on O2 at 2L/min pulse dose;  HEENT- neg, mallampati1;  Chest- clear w/o w/r/r;  Heart- rr, gr1/6SEM w/o r/g;  Abd- soft, neg;  Ext- 1+edema, no c/c.  LABS 03/24/15:  Chems- ok w/ Cr=1.27 & HCO3=30 on Lasix20+Diamox250;  CBC- 9.8, WBC=4.7, Plat=125K;  BNP=696 (prev 866)... IMP/PLAN>>  With Cr stable & BNP=696 we decided to incr the Lasix to  Qam, continue the Acetazolamide  Q-afternoon & recehck labs [phone]...   ADDENDUM>>  LABS 04/22/15 showed Chems- ok w/ Cr=1.35, HCO3=30, BNP=559, and 42mo f/u SPE/IEP- similar restricted bands w/o monoclonal gammopathy; referred to HEME for pancytopenia...    Past Medical History  Diagnosis Date  . On home oxygen therapy >> on Home O2 at 2L/min continuous... 08/13/2014    Commencing 05/2014   . Moderate to severe pulmonary hypertension >> on Oxygen, CPAP Qhs, Lasix/Diamox 08/13/2014    Echo 05/2014 PASP 61 mmHg   . Moderate mitral regurgitation 08/13/2014    Echo 2016   . Essential hypertension >> on Metop50-1/2Bid, Lisin40, Cardura2, Lasix20/ Diamox250 08/13/2014  . CHF (congestive heart failure)         --bilat LE edema & fatigue    GERD >. on Prilosec20     Vit D Deficiency >> on VitD 50K weekly     No past surgical history on file.  S/P Appendectomy & Hernia repair...   Outpatient Encounter Prescriptions as of 03/24/2015   Medication Sig  . acetaZOLAMIDE (DIAMOX) 250 MG tablet TAKE 1 TABLET(250 MG) BY MOUTH DAILY  . aspirin 81 MG tablet Take 81 mg by mouth daily as needed (pain).   Marland Kitchen beta carotene w/minerals (OCUVITE) tablet Take 1 tablet by mouth daily.  Marland Kitchen doxazosin (CARDURA) 2 MG tablet Take 1 mg by mouth 2 (two) times daily.   . furosemide (LASIX) 20 MG tablet Take 1 tablet (20 mg total) by mouth daily.  Marland Kitchen lisinopril (PRINIVIL,ZESTRIL) 40 MG tablet Take 40 mg by mouth daily.  . metoprolol succinate (TOPROL-XL) 100 MG 24 hr tablet Take 0.5 tablets by mouth 2 (two) times daily.  Marland Kitchen omeprazole (PRILOSEC) 20 MG capsule Take 20 mg by mouth daily.  . OXYGEN as directed. 2L/MIN BY CANNULA  . Vitamin D, Ergocalciferol, (DRISDOL) 50000 UNITS CAPS capsule Take 50,000 Units by mouth once a week. MONDAYS  . Vitamin Mixture (VITAMIN E COMPLETE) CAPS Take 1 capsule by mouth daily as needed (DIZZINESS).  . [DISCONTINUED] amoxicillin (AMOXIL) 500 MG capsule Take for  7 days as directed   No facility-administered encounter medications on file as of 03/24/2015.    Allergies  Allergen Reactions  . Amlodipine Shortness Of Breath and Other (See Comments)    REACTION: "hot/cold flashes"  . Celecoxib Anxiety and Rash  . Chlorthalidone Anxiety and Rash  . Hydrochlorothiazide Anxiety and Rash  . Sulfa Antibiotics Other (See Comments)    REACTION: "UNKNOWN...BEEN SO LONG AGO"    Current Medications, Allergies, Past Medical History, Past Surgical History, Family  History, and Social History were reviewed in [employer] record.   Review of Systems            All symptoms NEG except where BOLDED >>  Constitutional:  F/C/S, fatigue, anorexia, unexpected weight change. HEENT:  HA, visual changes, hearing loss, earache, nasal symptoms, sore throat, mouth sores, hoarseness. Resp:  cough, sputum, hemoptysis; SOB, tightness, wheezing. Cardio:  CP, palpit, DOE, orthopnea, edema. GI:  N/V/D/C, blood in stool;  reflux, abd pain, distention, gas. GU:  dysuria, freq, urgency, hematuria, flank pain, voiding difficulty. MS:  joint pain, swelling, tenderness, decr ROM; neck pain, back pain, etc. Neuro:  HA, tremors, seizures, dizziness, syncope, weakness, numbness, gait abn. Skin:  suspicious lesions or skin rash. Heme:  adenopathy, bruising, bleeding. Psyche:  confusion, agitation, sleep disturbance, hallucinations, anxiety, depression suicidal.   Objective:   Physical Exam      Vital Signs:  Reviewed...  General:  WD, WN, 81 y/o WF in NAD, chr ill appearing; alert & oriented; pleasant & cooperative... HEENT:  [place]/AT; Conjunctiva- pink, Sclera- nonicteric, EOM-wnl, PERRLA, EACs-clear, TMs-wnl; NOSE-clear; THROAT-clear & wnl. Neck:  Supple w/ fair ROM; no JVD; normal carotid impulses w/o bruits; no thyromegaly or nodules palpated; no lymphadenopathy. Chest:  Clear to P & A; without wheezes, rales, or rhonchi heard. Heart:  Regular Rhythm; without murmurs, rubs, or gallops detected. Abdomen:  Soft & nontender- no guarding or rebound; normal bowel sounds; no organomegaly or masses palpated. Ext:  decrROM; +arthritic changes; no varicose veins, +venous insuffic, tr edema;  Pulses intact w/o bruits. Neuro:  No focal neuro deficits; gait normal & balance OK. Derm:  No lesions noted; no rash etc. Lymph:  No cervical, supraclavicular, axillary, or inguinal adenopathy palpated.   Assessment:      IMP >>  19 y/o woman w/     Combined obstructive & restrictive lung dis> see spirometry, remember she is 81 y/o, we will consider f/u w/ FullPFTs later; she refused Anoro trial...    6mm RLL pulmonary nodule> we will plan f/u CT in 6 months    Pulmonary HTN w/ PASP~60 by 2DEcho in [place] 2/16> likely secondary to valvular heart dis, diastolic CHF, OSA, combined obstructive & restrictive lung dis...     Hypoxemia> treated w/ home O2 at 2L/min 24/7...    Pos Quantiferon Gold w/ hx remote Tb exposure, NEG CXR- no  active dis...    Anemia-- Hg=9.6 (?ACD) w/ norm Fe/ B12/ and neg SPE/IEP so far...  Sleep Apnea evaluated & treated by DrTTurner:     OSA> sleep study done by DrSmith/ DrTurner & CPAP titration study done & optimum CPAP=5cmH20;  Stable on CPAP per DrTurner 01/2015 w/ compliant download...  Cardiac eval and treatment per DrHSmith:     HBP> controlled on meds- ASA81, Metop25Bid, Lisin40, Cardura2Bid, Lasix20, Diamox250    Extensive coronary calcifications>    Valvular heart disease w/ modMR & TR>        Diastolic CHF>    Pulmonary HTN w/ PASP~60 by 2DEcho in [place] 2/16> likely secondary to valvular heart dis, diastolic CHF, OSA, combined obstructive & restrictive lung dis...   PLAN >>  6/29>  Brittini's symptoms would seem to place her in [facility] Group 3 & the likely etiology of her pulmHTN appears multifactorial w/ cardiac dis, OSA, poss combined pulmonary dis all playing a roll;  Currently she is on O2 at 2L/min & needs to continue this;  Cardiac problems- HBP, diastolicCHF w/ edema, valvular heart dis, and  extensive coronary calcif is being managed and followed by DrSmith;  OSA is being managed and followed by DrTurner;  I am reluctant to push the diuretics any further w/ Cr=1.32 but BNP remains elev;  We will add ANORO one puff daily as a trial;  She needs ROV w/ Cards- DrHSmith & has yet to see DrTurner for her OSA- CPAP titration test pending;  Ultimately when all treatments in place & optimized they will want to recheck 2DEcho to see if PASP has improved, otherw might be a candidate for RHC & PH therapy despite her age. 7/26>  She will continue the O2 at 2L/min 24/7;  I am reluctant to push the diuretics any further w/ Cr=1.32 but BNP remains elev;  We will add ANORO one puff daily as a trial;  She needs ROV w/ Cards- DrHSmith & has yet to see DrTurner for her OSA- CPAP titration test pending;  Ultimately when all treatments in place & optimized they will want to recheck 2DEcho to see if PASP has improved,  otherw might be a candidate for RHC & PH therapy despite her age. 9/9>  She needs f/u w/ her cardiologist & sleep med; we will plan recheck in 3-4 month w/ repeat CT Chest to f/u on nodule at that time 12/5>  With Cr stable & BNP=696 we decided to incr the Lasix to 40mg  Qam, continue the Acetazolamide 250mg  Q-afternoon & recehck labs [phone].     Plan:     Patient's Medications  New Prescriptions   No medications on file  Previous Medications   ACETAZOLAMIDE (DIAMOX) 250 MG TABLET    TAKE 1 TABLET(250 MG) BY MOUTH DAILY   ASPIRIN 81 MG TABLET    Take 81 mg by mouth daily as needed (pain).    BETA CAROTENE W/MINERALS (OCUVITE) TABLET    Take 1 tablet by mouth daily.   DOXAZOSIN (CARDURA) 2 MG TABLET    Take 1 mg by mouth 2 (two) times daily.    FUROSEMIDE (LASIX) 20 MG TABLET    Take 1 tablet (20 mg total) by mouth daily.   LISINOPRIL (PRINIVIL,ZESTRIL) 40 MG TABLET    Take 40 mg by mouth daily.   METOPROLOL SUCCINATE (TOPROL-XL) 100 MG 24 HR TABLET    Take 0.5 tablets by mouth 2 (two) times daily.   OMEPRAZOLE (PRILOSEC) 20 MG CAPSULE    Take 20 mg by mouth daily.   OXYGEN    as directed. 2L/MIN BY CANNULA   VITAMIN D, ERGOCALCIFEROL, (DRISDOL) 50000 UNITS CAPS CAPSULE    Take 50,000 Units by mouth once a week. MONDAYS   VITAMIN MIXTURE (VITAMIN E COMPLETE) CAPS    Take 1 capsule by mouth daily as needed (DIZZINESS).  Modified Medications   No medications on file  Discontinued Medications   AMOXICILLIN (AMOXIL) 500 MG CAPSULE    Take for  7 days as directed

## 2015-03-28 ENCOUNTER — Ambulatory Visit: Payer: Medicare Other | Admitting: Pulmonary Disease

## 2015-04-22 ENCOUNTER — Other Ambulatory Visit (INDEPENDENT_AMBULATORY_CARE_PROVIDER_SITE_OTHER): Payer: Medicare Other

## 2015-04-22 DIAGNOSIS — R911 Solitary pulmonary nodule: Secondary | ICD-10-CM | POA: Diagnosis not present

## 2015-04-22 DIAGNOSIS — I509 Heart failure, unspecified: Secondary | ICD-10-CM

## 2015-04-22 DIAGNOSIS — I340 Nonrheumatic mitral (valve) insufficiency: Secondary | ICD-10-CM | POA: Diagnosis not present

## 2015-04-22 DIAGNOSIS — I38 Endocarditis, valve unspecified: Secondary | ICD-10-CM | POA: Diagnosis not present

## 2015-04-22 DIAGNOSIS — I272 Other secondary pulmonary hypertension: Secondary | ICD-10-CM

## 2015-04-22 DIAGNOSIS — R0600 Dyspnea, unspecified: Secondary | ICD-10-CM

## 2015-04-22 LAB — BASIC METABOLIC PANEL
BUN: 19 mg/dL (ref 6–23)
CHLORIDE: 105 meq/L (ref 96–112)
CO2: 30 meq/L (ref 19–32)
CREATININE: 1.35 mg/dL — AB (ref 0.40–1.20)
Calcium: 9.2 mg/dL (ref 8.4–10.5)
GFR: 38.73 mL/min — ABNORMAL LOW (ref >60.00)
Glucose, Bld: 122 mg/dL — ABNORMAL HIGH (ref 70–99)
POTASSIUM: 4.1 meq/L (ref 3.5–5.1)
Sodium: 140 mEq/L (ref 135–145)

## 2015-04-22 LAB — BRAIN NATRIURETIC PEPTIDE: PRO B NATRI PEPTIDE: 559 pg/mL — AB (ref 0.0–100.0)

## 2015-04-24 LAB — IMMUNOFIXATION ELECTROPHORESIS
IGA: 245 mg/dL (ref 69–380)
IGG (IMMUNOGLOBIN G), SERUM: 1190 mg/dL (ref 690–1700)
IGM, SERUM: 76 mg/dL (ref 52–322)
Total Protein, Serum Electrophoresis: 6.5 g/dL (ref 6.0–8.3)

## 2015-04-28 LAB — PROTEIN ELECTROPHORESIS, SERUM
ABNORMAL PROTEIN BAND2: NOT DETECTED g/dL
ABNORMAL PROTEIN BAND3: NOT DETECTED g/dL
ALBUMIN ELP: 4 g/dL (ref 3.8–4.8)
ALPHA-1-GLOBULIN: 0.2 g/dL (ref 0.2–0.3)
Alpha-2-Globulin: 0.6 g/dL (ref 0.5–0.9)
Beta 2: 0.3 g/dL (ref 0.2–0.5)
Beta Globulin: 0.3 g/dL — ABNORMAL LOW (ref 0.4–0.6)
Gamma Globulin: 1.1 g/dL (ref 0.8–1.7)
Total Protein, Serum Electrophoresis: 6.5 g/dL (ref 6.1–8.1)

## 2015-04-30 ENCOUNTER — Telehealth: Payer: Self-pay | Admitting: Pulmonary Disease

## 2015-04-30 DIAGNOSIS — I272 Pulmonary hypertension, unspecified: Secondary | ICD-10-CM

## 2015-04-30 MED ORDER — FUROSEMIDE 20 MG PO TABS: 40.0000 mg | ORAL_TABLET | Freq: Every day | ORAL | Status: DC

## 2015-04-30 NOTE — Telephone Encounter (Signed)
Pt returning call for results and can be reached @ [phone]//sad

## 2015-04-30 NOTE — Telephone Encounter (Signed)
Per SN: Notify pt - follow up labs on 04/22/15 returned stable- stable renal, stable blood ct etc. Rec to continue Lasix 20, 2 tabs each AM, no salt, keep appt sched for March  [facility]

## 2015-04-30 NOTE — Telephone Encounter (Signed)
Results have been explained to patient, pt expressed understanding. Nothing further needed.  

## 2015-04-30 NOTE — Telephone Encounter (Signed)
[facility] Nothing further needed.

## 2015-04-30 NOTE — Telephone Encounter (Signed)
Jordan Barker, son, called stating patient was told to increase her Lasix to 2 tabs in the am.  Original RX was made by Dr. Katrinka BlazingSmith and patient will run out.  Needs a new RX for new dosage sent to [facility] in [place].  CB for Tilden Fossaeil Friese is [phone].

## 2015-05-01 ENCOUNTER — Telehealth: Payer: Self-pay | Admitting: Pulmonary Disease

## 2015-05-01 DIAGNOSIS — D61818 Other pancytopenia: Secondary | ICD-10-CM

## 2015-05-01 NOTE — Telephone Encounter (Signed)
Order placed for Hematology referral

## 2015-05-01 NOTE — Telephone Encounter (Signed)
Per SN:  I called her.  Please refer to Hematology for eval of Pancytopenia.

## 2015-05-01 NOTE — Telephone Encounter (Signed)
Called spoke with pt son. He reports that they saw pt PCP Dr. Haze RushingYbanez the other day. They saw pt hemoglobin labs from December and wants to know if pt should be referred out to see why this is low. It was the same in June as well. Please advise SN thanks

## 2015-05-05 ENCOUNTER — Other Ambulatory Visit: Payer: Self-pay | Admitting: Interventional Cardiology

## 2015-05-05 ENCOUNTER — Other Ambulatory Visit: Payer: Self-pay

## 2015-06-23 ENCOUNTER — Ambulatory Visit (INDEPENDENT_AMBULATORY_CARE_PROVIDER_SITE_OTHER)
Admission: RE | Admit: 2015-06-23 | Discharge: 2015-06-23 | Disposition: A | Discharge status: Home / Self Care | Payer: Medicare Other | Source: Ambulatory Visit | Attending: Pulmonary Disease | Admitting: Pulmonary Disease

## 2015-06-23 ENCOUNTER — Other Ambulatory Visit (INDEPENDENT_AMBULATORY_CARE_PROVIDER_SITE_OTHER): Payer: Medicare Other

## 2015-06-23 ENCOUNTER — Ambulatory Visit (INDEPENDENT_AMBULATORY_CARE_PROVIDER_SITE_OTHER): Payer: Medicare Other | Admitting: Pulmonary Disease

## 2015-06-23 ENCOUNTER — Encounter: Payer: Self-pay | Admitting: Pulmonary Disease

## 2015-06-23 VITALS — BP 142/64 | HR 67 | Temp 97.0°F | Ht 64.0 in | Wt 146.4 lb

## 2015-06-23 DIAGNOSIS — I5032 Chronic diastolic (congestive) heart failure: Secondary | ICD-10-CM | POA: Diagnosis not present

## 2015-06-23 DIAGNOSIS — I272 Other secondary pulmonary hypertension: Secondary | ICD-10-CM

## 2015-06-23 DIAGNOSIS — J9611 Chronic respiratory failure with hypoxia: Secondary | ICD-10-CM

## 2015-06-23 DIAGNOSIS — I38 Endocarditis, valve unspecified: Secondary | ICD-10-CM

## 2015-06-23 DIAGNOSIS — G4733 Obstructive sleep apnea (adult) (pediatric): Secondary | ICD-10-CM

## 2015-06-23 DIAGNOSIS — R911 Solitary pulmonary nodule: Secondary | ICD-10-CM

## 2015-06-23 LAB — BASIC METABOLIC PANEL
BUN: 20 mg/dL (ref 6–23)
CHLORIDE: 101 meq/L (ref 96–112)
CO2: 31 meq/L (ref 19–32)
CREATININE: 1.44 mg/dL — AB (ref 0.40–1.20)
Calcium: 9.3 mg/dL (ref 8.4–10.5)
GFR: 35.94 mL/min — ABNORMAL LOW (ref >60.00)
Glucose, Bld: 138 mg/dL — ABNORMAL HIGH (ref 70–99)
Potassium: 4 mEq/L (ref 3.5–5.1)
Sodium: 138 mEq/L (ref 135–145)

## 2015-06-23 LAB — CBC WITH DIFFERENTIAL/PLATELET
BASOS ABS: 0 10*3/uL (ref 0.0–0.1)
BASOS PCT: 0.4 % (ref 0.0–3.0)
Eosinophils Absolute: 0.5 10*3/uL (ref 0.0–0.7)
Eosinophils Relative: 8.4 % — ABNORMAL HIGH (ref 0.0–5.0)
HEMATOCRIT: 29 % — AB (ref 36.0–46.0)
Hemoglobin: 9.6 g/dL — ABNORMAL LOW (ref 12.0–15.0)
Lymphocytes Relative: 29 % (ref 12.0–46.0)
Lymphs Abs: 1.7 10*3/uL (ref 0.7–4.0)
MCHC: 33.2 g/dL (ref 30.0–36.0)
MCV: 90.6 fl (ref 78.0–100.0)
Monocytes Absolute: 0.6 10*3/uL (ref 0.1–1.0)
Monocytes Relative: 9.7 % (ref 3.0–12.0)
NEUTROS ABS: 3.2 10*3/uL (ref 1.4–7.7)
Neutrophils Relative %: 52.5 % (ref 43.0–77.0)
PLATELETS: 138 10*3/uL — AB (ref 150.0–400.0)
RBC: 3.19 Mil/uL — ABNORMAL LOW (ref 3.87–5.11)
RDW: 14.2 % (ref 11.5–15.5)
WBC: 6 10*3/uL (ref 4.0–10.5)

## 2015-06-23 LAB — BRAIN NATRIURETIC PEPTIDE: PRO B NATRI PEPTIDE: 643 pg/mL — AB (ref 0.0–100.0)

## 2015-06-23 NOTE — Progress Notes (Signed)
Subjective:     Patient ID: Jordan Barker, female   DOB: 12/14/1918, 82 y.o.   MRN: [id number]  HPI 82 y/o WF w/ pulmonary nodule on CT Chest, pulmonary hypertension, and hypoxemia;  She was exposed to TB yrs ago from her ex-husband and she has a POS Quantiferon Gold test w/ CXR showing pleuroparenchymal scarring in the apicies bilat;  She had hypoxemic resp failure [facility]- HBP, Valvular heart dis w/ modMR & mod to severeTR, DiastolicCHF (BNP was 8500), & PulmHTN (PASP~est60);  She now sees DrHSmith for CARDS and DrTTurner for SLEEP MED on CPAP (see below)...    ~  October 16, 2014:  Initial pulmonary consult w/ SN>>      82 y/o WF, referred by Dr. Garnette ScheuermannHank Smith (CARDS), for a pulmonary evaluation due to pulmonary nodule, pulmHTN, and hypoxemia>>  Her PCP is Dr. Ardyth GalJane Ybanez in [place] & I have reviewed the [facility]bane[facility], & DrMcGukln (Cards)...  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    Available data reveals hx of progressive dyspnea w/ chest pressure & some pedal edema, no cough/ sputum/ hemoptysis => Adm [facility] 05/2013 w/ hypoxemic RF, HBP, Valvular heart dis w/ modMR & mod to severeTR, DiastolicCHF (BNP was 8500), & PulmHTN (PASP~est60);  She was started on Oxygen, diuresed, & improved;  She had Cards f/u 07/03/14 w/ DrMcGukln in [place]> much improved w/ BP= 122/62 & w/o right heart failure signs or symptoms, asked to decr Lasix to prn edema or wt gain, continue the home O2...  [facility] showed mod LVH w/ norm LVF (EF=65-70%), no  mention of diastolic dysfunction, mild to modMR, mod to severeTR, LA&RA enlargement, norm RV size & function w/ est RVSP=5861mmHg...     She saw Dr. Garnette ScheuermannHank Smith for a cardiac second opinion 08/13/14> he felt that her mod pulmHTN was likely due to her DiastolicCHF, modMR, mod to severe TR, acute on chr diastolicCHF  (He noted no prev hx DVT, PE, OSA, connective tissue disorders, or use of diet pills);  He did further evaluation including>   EKG 08/13/14 showed SBrady, rate56, otherw norm EKG...   CT Angio Chest 08/14/14 showed > NEG for PE, mild cardiomegaly & atherosclerotic calcif in coronaries/ arch/ branch vessels, biapical pleuroparenchymal scarring + scattered scarring/atx, 6mm RLL pulm nodule, ?mosaic attenuation/ ?vasc attenuation mentioned by radiologist...   Sleep Study done 08/13/14 ([facility])> AHI=14 events per Hr (most in REM where the AHI=50 & all while supine); severe O2 desat down to 73%, few limb movements; Dr. Armanda Magicraci Turner has been consulted & ordered  CPAP titration test. EXAM reveals Afeb, VSS, O2sat=94% on O2 at 2L/min pulse dose;  HEENT- neg, mallampati1;  Chest- clear w/o w/r/r;  Heart- rr, gr1/6SEM w/o r/g;  Abd- soft, neg;  Ext- trace edema, no c/c...  CXR 10/16/14 showed heart at upper lim of norm in size, COPD/ clear lungs (nodule in RLL is not vis on CXR), NAD, +DJD in spine...   Spirometry 10/16/14 showed FVC=1.26 (60%), FEV1=0.70 (50%), %1sec=56, mid-flows  are reduced at 43% predicted; suspect combined obstructive & restrictive pattern, consider Full PFTs at a later date...   Ambulatory oxygen saturation test 10/16/14 on O2 at 2L/min pulse dose>  O2sat=94% on 2L/min pulse at rest w/ HR=78/min; she ambulated 2 Laps on her O2 w/ lowest O2sat=90% w/ HR=86/min...  LABS 6/16:  Chems- ok x HCO3=35, BUN=16, Cr=1.21, BNP=906;  CBC- Hg=9.6, MCV=93.7, Plat=130K, Sed=23;  TSH=2.04;  QuantGold= POSITIVE  Further Labs w/ Fe=99 (41%sat), Ferritin=170, B12=634, MM panel  showed norm Ig's but sl restricted mobility in IgG & Kappa lanes- suggest repeat in 54mo...   IMP/PLAN>>  Kyree's symptoms would seem to place her in WHO Group 3, & the likely etiology of her pulmHTN appears multifactorial w/ cardiac dis, OSA, poss combined pulmonary dis all playing a roll;  Currently she is on O2 at 2L/min & needs to continue this;  Cardiac problems- HBP, diastolicCHF w/ edema, valvular heart dis, and extensive coronary calcif is being managed and followed by DrSmith;  She has some mild edema and BNP=906 on her Lasix20, so we discussed adding DIAMOX250mg /d for the next month w/ recheck;  OSA is being managed and followed by DrTurner;  Additionally she has a mod anemia and we will bring her back for an anemia work up to r/o other process here...   ~  November 12, 2014:  41mo ROV and Alysiana has lost 5# on the combination of Lasix20 & Diamox250; she states that he breathing is about the same- no real change noted (walking is ok indoors but notes same SOB when walking outside), she denies CP/ pressure but is c/o some back discomfort in upper back which she blames on slumping posture... 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    6mm RLL pulmonary nodule on CT Chest>  This is asymptomatic and incidental, we will plan f/u CT in 6 months.    Pulmonary HTN w/ PASP~60 by 2DEcho in [place] 2/16> on O2 at 2L/min 24/7; this is most likely multifactorial secondary to valvular heart dis, diastolic CHF, OSA, combined obstructive & restrictive lung dis...     Hypoxemia> treated w/ home O2 at 2L/min 24/7...    OSA> sleep study done by DrSmith/ DrTurner & CPAP titration study pending; she needs to get started on CPAP ASAP w/ documentation of efficacy & no nocturnal hypoxemia as part of her PulmHTN protocol...    Pos  Quantiferon Gold> w/ hx remote Tb exposure, NEG CXR- no active dis...    Extensive coronary calcifications> on ASA81 and followed by DrHSmith...    Valvular heart disease w/ modMR & TR> followed by DrHSmith for Cards...    Diastolic CHF> on [id number], Lisin40, Cardura2Bid, Lasix20, Diamox250; Labs today showed BUN=22, Cr=1.32, K=4.2, BNP=866    HBP> controlled on meds above & well contrlled w/ BP= 136/60 today, denies CP/ palpit/ edema...    Anemia> we did prelim eval w/ Hg=9.6, MCV=94, Sed=23, Fe=99 (41%sat), Ferritin=170, B12=634, SPE/IEP showed no M-spike, but area of slightly restricted mobility in the IgG and Kappa lanes noted- plan repeat in 54mo EXAM reveals Afeb, VSS, O2sat=90% on O2 at 2L/min pulse dose;  HEENT- neg, mallampati1;  Chest- clear w/o w/r/r;  Heart- rr, gr1/6SEM w/o r/g;  Abd- soft, neg;  Ext- trace edema, no c/c.Marland KitchenMarland Kitchen  LABS 7/16:  Chems- ok w/ BUN=22, Cr=1.32, K=4.2, but BNP=866 (prev 906) IMP/PLAN>>  She will continue the O2 at 2L/min 24/7;  I am reluctant to push the diuretics any further w/ Cr=1.32 but BNP remains elev;  We will add ANORO one puff daily as a trial;  She needs ROV w/ Cards- DrHSmith & has yet to see DrTurner for her OSA- CPAP titration test pending;  Ultimately when all treatments in place & optimized they will want to recheck 2DEcho to see if PASP has improved, otherw might be a candidate for RHC & PH therapy despite her age...    ~  September 9. 2016:  6wk ROV & when last seen we added ANORO one inhalation daily but she states that she "didn't like it" and only used it once or twice then through out the sample "it's not worth it to me";  She hasn't yet followed up w/ her Cardiologist- DrHSmith, & hasn't seen her Sleep doctor- DrTTurner yet despite being on CPAP for the last 3wks she says...    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    6mm  RLL pulmonary nodule on CT Chest>  This is asymptomatic and incidental, we will plan f/u CT at her next visit...    Pulmonary HTN w/ PASP~60 by 2DEcho in [place] 2/16> on O2 at 2L/min 24/7; this is most likely multifactorial secondary to valvular heart dis, diastolic CHF, OSA, combined obstructive & restrictive lung dis...     Hypoxemia> treated w/ home O2 at 2L/min 24/7...    OSA> sleep study done by DrSmith/ DrTurner & CPAP titration study completed & placed on low dose CPAP~6cmH2O => she hasn't seen DrTurner yet but was started on CPAP as part of her PulmHTN protocol...    Pos Quantiferon Gold> w/ hx remote Tb exposure, NEG CXR- no active dis... EXAM reveals Afeb, VSS, O2sat=97% on O2 at 2L/min pulse dose;  HEENT- neg, mallampati1;  Chest- clear w/o w/r/r;  Heart- rr, gr1/6SEM w/o r/g;  Abd- soft, neg;  Ext- 1+edema, no c/c... We reviewed prob list, meds, xrays and labs>> IMP/PLAN>>  She needs f/u w/ her cardiologist & sleep med; we will plan recheck in 3-4 month w/ repeat CT Chest to f/u on nodule at that time...   ~  March 24, 2015:  45mo ROV & Peg had her f/u w/ CARDS office PA 10/4> OSA, PulmHTN, modMR, severeTR, CKD; no CP, c/o fatigue & dyspnea, doesn't like the O2; they recommended a conservative approach in this 82 y/o woman- same meds, optimize CPAP, they did not favor RHC or specific pulmHTN meds, they will keep watch on 2DEcho...  She also saw her sleep doc DrTTurner 10/19> ESS=11, PSG showed mild to mod OSA w/ AHI=14.4/hr w/ severe O2sat to 73%, CPAP titration to 5cmH2O w/ full face mask & she reports doing satis- more rested, less sleepy; download revealed AHI=1.1/hr on 5cmH20 pressure & using it >4H/night 83% of the time...  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    6mm RLL pulmonary nodule on  CT Chest>  This is asymptomatic and incidental, we will plan f/u CT later.Marland KitchenMarland Kitchen  Pulmonary HTN w/ PASP~60 by 2DEcho in [place] 2/16> on O2 at 2L/min 24/7; this is most likely multifactorial secondary to valvular heart dis, diastolic CHF, OSA, combined obstructive & restrictive lung dis; followed by DrHSmith & they plan conservative approach & serial Echos......     Hypoxemia> treated w/ home O2 at 2L/min 24/7...    OSA> sleep study done by DrSmith/ DrTurner & CPAP titration study completed & placed on CPAP 5cmH2O => she had f/u 01/2015 & improved...    Pos Quantiferon Gold> w/ hx remote Tb exposure, NEG CXR- no active dis & not producing any sput...    Cards-- HBP, extensive coronary calcif on CTChest, valvular heart dis w/ modMR, severeTR, diastolicCHF & followed by DrHSmith    Renal insuffic> Cr in the 1.3-1.4 range on meds    Pancytopenia>  Prev blood work by PCP not avail for review- Labs 6/16 showed Hg=9.6, MCV=93.7, Plat=130K, Sed=23; Fe=99 (41%sat), Ferritin=170, B12=634, MM panel showed norm Ig's but sl restricted mobility in IgG & Kappa lanes- suggest repeat in 72mo => Labs 12/16 showed Hg=9.8, MCV=93.7, WBC=4.7, Plat=125K; SPE/IEP- similar findings w/ area of slightly restricted mobility in the IgG and Kappa lanes;  They are concerned & we will refer to HEME...  EXAM reveals Afeb, VSS, O2sat=96% on O2 at 2L/min pulse dose;  HEENT- neg, mallampati1;  Chest- clear w/o w/r/r;  Heart- rr, gr1/6SEM w/o r/g;  Abd- soft, neg;  Ext- 1+edema, no c/c.  LABS 03/24/15:  Chems- ok w/ Cr=1.27 & HCO3=30 on Lasix20+Diamox250;  CBC- 9.8, WBC=4.7, Plat=125K;  BNP=696 (prev 866)... IMP/PLAN>>  With Cr stable & BNP=696 we decided to incr the Lasix to 40mg  Qam, continue the Acetazolamide 250mg  Q-afternoon & recehck labs [phone]...  ADDENDUM>>  LABS 04/22/15 showed Chems- ok w/ Cr=1.35, HCO3=30, BNP=559, and 72mo f/u SPE/IEP- similar restricted bands w/o monoclonal gammopathy; referred to HEME for pancytopenia...   ~  June 23, 2015:  72mo ROV & pulmonary follow up visit>  She reports that her breathing has been stable over the interval; she has been using her O2 regularly (although she notes that she leaves it off for brief periods to shower etc & sats remain in the 90's on her home checks); she is due for the 53yr f/u CT Chest to check the 6mm RLL nodule seen 07/2014;  Her CC is some left hip pain & she takes OTC meds prn... We reviewed the above medical issues- she is reminded to f/u w/ DrHSmith & DrTTurner... EXAM reveals Afeb, VSS, O2sat=98% on O2 at 2L/min pulse dose;  HEENT- neg, mallampati1;  Chest- clear w/o w/r/r;  Heart- rr, gr1/6SEM w/o r/g;  Abd- soft, neg;  Ext- 1+edema, no c/c.  CXR 06/23/15>  Mild hyperinflation, atherosclerotic Ao, osteopenia & degen changes in Tspine, 6mm right base nodule w/o change- NAD...   LABS 06/23/15>  Chems- stable w/ BS=138, BUN=20, Cr=1.44, BNP=643;  CBC- stable w/ Hg=9.6, Hct=29.0, Plat=138K, WBC=6.0;  SPE/IEP> similar results (note- IEP wasn't done)...  CT Chest 07/03/15 showed mild cardiomeg, atherosclerotic Ao & enlarge PA w/ 3.8cm outflow tract; bilat pulm nodules are all similar to prev, one new RLL nodule ~35mm size, smallHH, no adenopathy...  IMP/PLAN>>  She is rec to continue O2 24/7 and same meds for now; she needs regular f/u w/ Cards- DrHSmith & DrTTurner; we will refer to Heme per request for her anemia & borderline MGUS; we plan recheck in 72mo, sooner if needed...     Past Medical History  Diagnosis Date  .  On home oxygen therapy >> on Home O2 at 2L/min continuous... 08/13/2014    Commencing 05/2014   . Moderate to severe pulmonary hypertension >> on Oxygen, CPAP Qhs, Lasix/Diamox 08/13/2014    Echo 05/2014 PASP 61 mmHg   . Moderate mitral regurgitation 08/13/2014    Echo 2016   . Essential hypertension >> on Metop50-1/2Bid, Lisin40, Cardura2, Lasix20/ Diamox250 08/13/2014  . CHF (congestive heart failure)         --bilat LE edema & fatigue    GERD >. on Prilosec20      Vit D Deficiency >> on VitD 50K weekly     No past surgical history on file.  S/P Appendectomy & Hernia repair...   Outpatient Encounter Prescriptions as of 06/23/2015  Medication Sig  . acetaZOLAMIDE (DIAMOX) 250 MG tablet TAKE 1 TABLET(250 MG) BY MOUTH DAILY  . aspirin 81 MG tablet Take 81 mg by mouth daily as needed (pain).   Marland Kitchen beta carotene w/minerals (OCUVITE) tablet Take 1 tablet by mouth daily.  Marland Kitchen doxazosin (CARDURA) 2 MG tablet Take 1 mg by mouth 2 (two) times daily.   . furosemide (LASIX) 20 MG tablet Take 2 tablets (40 mg total) by mouth daily.  Marland Kitchen lisinopril (PRINIVIL,ZESTRIL) 40 MG tablet Take 40 mg by mouth daily.  . metoprolol succinate (TOPROL-XL) 100 MG 24 hr tablet Take 0.5 tablets by mouth 2 (two) times daily.  Marland Kitchen omeprazole (PRILOSEC) 20 MG capsule Take 20 mg by mouth daily.  . OXYGEN as directed. 2L/MIN BY CANNULA  . Vitamin Mixture (VITAMIN E COMPLETE) CAPS Take 1 capsule by mouth daily as needed (DIZZINESS).   No facility-administered encounter medications on file as of 06/23/2015.    Allergies  Allergen Reactions  . Amlodipine Shortness Of Breath and Other (See Comments)    REACTION: "hot/cold flashes"  . Celecoxib Anxiety and Rash  . Chlorthalidone Anxiety and Rash  . Hydrochlorothiazide Anxiety and Rash  . Sulfa Antibiotics Other (See Comments)    REACTION: "UNKNOWN...BEEN SO LONG AGO"    Current Medications, Allergies, Past Medical History, Past Surgical History, Family History, and Social History were reviewed in [employer] record.   Review of Systems            All symptoms NEG except where BOLDED >>  Constitutional:  F/C/S, fatigue, anorexia, unexpected weight change. HEENT:  HA, visual changes, hearing loss, earache, nasal symptoms, sore throat, mouth sores, hoarseness. Resp:  cough, sputum, hemoptysis; SOB, tightness, wheezing. Cardio:  CP, palpit, DOE, orthopnea, edema. GI:  N/V/D/C, blood in stool; reflux, abd pain,  distention, gas. GU:  dysuria, freq, urgency, hematuria, flank pain, voiding difficulty. MS:  joint pain, swelling, tenderness, decr ROM; neck pain, back pain, etc. Neuro:  HA, tremors, seizures, dizziness, syncope, weakness, numbness, gait abn. Skin:  suspicious lesions or skin rash. Heme:  adenopathy, bruising, bleeding. Psyche:  confusion, agitation, sleep disturbance, hallucinations, anxiety, depression suicidal.   Objective:   Physical Exam      Vital Signs:  Reviewed...   General:  WD, WN, 82 y/o WF in NAD, chr ill appearing; alert & oriented; pleasant & cooperative... HEENT:  [place]/AT; Conjunctiva- pink, Sclera- nonicteric, EOM-wnl, PERRLA, EACs-clear, TMs-wnl; NOSE-clear; THROAT-clear & wnl.  Neck:  Supple w/ fair ROM; no JVD; normal carotid impulses w/o bruits; no thyromegaly or nodules palpated; no lymphadenopathy.  Chest:  Clear to P & A; without wheezes, rales, or rhonchi heard. Heart:  Regular Rhythm; without murmurs, rubs, or gallops detected. Abdomen:  Soft & nontender-  no guarding or rebound; normal bowel sounds; no organomegaly or masses palpated. Ext:  decrROM; +arthritic changes; no varicose veins, +venous insuffic, tr edema;  Pulses intact w/o bruits. Neuro:  No focal neuro deficits; gait normal & balance OK. Derm:  No lesions noted; no rash etc. Lymph:  No cervical, supraclavicular, axillary, or inguinal adenopathy palpated.   Assessment:      IMP >>  67 y/o woman w/     Combined obstructive & restrictive lung dis> see spirometry, remember she is 82 y/o, we will consider f/u w/ FullPFTs later; she refused Anoro trial...    6mm RLL pulmonary nodule> we will plan f/u CT in 6 months    Pulmonary HTN w/ PASP~60 by 2DEcho in [place] 2/16> likely secondary to valvular heart dis, diastolic CHF, OSA, combined obstructive & restrictive lung dis...     Hypoxemia> treated w/ home O2 at 2L/min 24/7...    Pos Quantiferon Gold w/ hx remote Tb exposure, NEG CXR- no active dis...     Anemia-- Hg=9.6 (?ACD) w/ norm Fe/ B12/ and neg SPE/IEP so far...  Sleep Apnea evaluated & treated by DrTTurner:     OSA> sleep study done by DrSmith/ DrTurner & CPAP titration study done & optimum CPAP=5cmH20;  Stable on CPAP per DrTurner 01/2015 w/ compliant download...  Cardiac eval and treatment per DrHSmith:     HBP> controlled on meds- ASA81, Metop25Bid, Lisin40, Cardura2Bid, Lasix20, Diamox250    Extensive coronary calcifications>    Valvular heart disease w/ modMR & TR>        Diastolic CHF>    Pulmonary HTN w/ PASP~60 by 2DEcho in [place] 2/16> likely secondary to valvular heart dis, diastolic CHF, OSA, combined obstructive & restrictive lung dis...   PLAN >>  6/29>  Daphney's symptoms would seem to place her in [facility] Group 3 & the likely etiology of her pulmHTN appears multifactorial w/ cardiac dis, OSA, poss combined pulmonary dis all playing a roll;  Currently she is on O2 at 2L/min & needs to continue this;  Cardiac problems- HBP, diastolicCHF w/ edema, valvular heart dis, and extensive coronary calcif is being managed and followed by DrSmith;  OSA is being managed and followed by DrTurner;  I am reluctant to push the diuretics any further w/ Cr=1.32 but BNP remains elev;  We will add ANORO one puff daily as a trial;  She needs ROV w/ Cards- DrHSmith & has yet to see DrTurner for her OSA- CPAP titration test pending;  Ultimately when all treatments in place & optimized they will want to recheck 2DEcho to see if PASP has improved, otherw might be a candidate for RHC & PH therapy despite her age. 7/26>  She will continue the O2 at 2L/min 24/7;  I am reluctant to push the diuretics any further w/ Cr=1.32 but BNP remains elev;  We will add ANORO one puff daily as a trial;  She needs ROV w/ Cards- DrHSmith & has yet to see DrTurner for her OSA- CPAP titration test pending;  Ultimately when all treatments in place & optimized they will want to recheck 2DEcho to see if PASP has improved, otherw might be a  candidate for RHC & PH therapy despite her age. 9/9>  She needs f/u w/ her cardiologist & sleep med; we will plan recheck in 3-4 month w/ repeat CT Chest to f/u on nodule at that time 12/5>  With Cr stable & BNP=696 we decided to incr the Lasix to 40mg  Qam, continue the Acetazolamide   Q-afternoon & recehck labs [phone].     Plan:     Patient's Medications  New Prescriptions   No medications on file  Previous Medications   ACETAZOLAMIDE (DIAMOX) 250 MG TABLET    TAKE 1 TABLET(250 MG) BY MOUTH DAILY   ASPIRIN 81 MG TABLET    Take 81 mg by mouth daily as needed (pain).    BETA CAROTENE W/MINERALS (OCUVITE) TABLET    Take 1 tablet by mouth daily.   DOXAZOSIN (CARDURA) 2 MG TABLET    Take 1 mg by mouth 2 (two) times daily.    FUROSEMIDE (LASIX) 20 MG TABLET    Take 2 tablets (40 mg total) by mouth daily.   LISINOPRIL (PRINIVIL,ZESTRIL) 40 MG TABLET    Take 40 mg by mouth daily.   METOPROLOL SUCCINATE (TOPROL-XL) 100 MG 24 HR TABLET    Take 0.5 tablets by mouth 2 (two) times daily.   OMEPRAZOLE (PRILOSEC) 20 MG CAPSULE    Take 20 mg by mouth daily.   OXYGEN    as directed. 2L/MIN BY CANNULA   VITAMIN MIXTURE (VITAMIN E COMPLETE) CAPS    Take 1 capsule by mouth daily as needed (DIZZINESS).  Modified Medications   No medications on file  Discontinued Medications   No medications on file

## 2015-06-23 NOTE — Patient Instructions (Signed)
Today we updated your med list in our EPIC system...    Continue your current medications the same...  Continue your regular oxygen therrapy & the CPAP every night...  Today we checked a follow up CXR & blood work... We will arrange for an outpt CT Chest to check the lung nodule...    We will contact you w/ the results when available...   Stay as active as poss...  Call for any questions...   Let's plan a follow up visit in 34mo, sooner if needed for problems.Marland Kitchen..Marland Kitchen

## 2015-06-25 ENCOUNTER — Ambulatory Visit: Payer: Medicare Other

## 2015-06-25 ENCOUNTER — Other Ambulatory Visit (HOSPITAL_BASED_OUTPATIENT_CLINIC_OR_DEPARTMENT_OTHER): Payer: Medicare Other

## 2015-06-25 ENCOUNTER — Ambulatory Visit (HOSPITAL_BASED_OUTPATIENT_CLINIC_OR_DEPARTMENT_OTHER): Payer: Medicare Other | Admitting: Hematology & Oncology

## 2015-06-25 ENCOUNTER — Encounter: Payer: Self-pay | Admitting: Hematology & Oncology

## 2015-06-25 VITALS — BP 134/50 | HR 61 | Temp 97.8°F | Resp 16 | Ht 64.0 in | Wt 145.0 lb

## 2015-06-25 DIAGNOSIS — D631 Anemia in chronic kidney disease: Secondary | ICD-10-CM

## 2015-06-25 DIAGNOSIS — D61818 Other pancytopenia: Secondary | ICD-10-CM

## 2015-06-25 DIAGNOSIS — I10 Essential (primary) hypertension: Secondary | ICD-10-CM | POA: Diagnosis not present

## 2015-06-25 DIAGNOSIS — Z9981 Dependence on supplemental oxygen: Secondary | ICD-10-CM | POA: Diagnosis not present

## 2015-06-25 DIAGNOSIS — N183 Chronic kidney disease, stage 3 (moderate): Secondary | ICD-10-CM | POA: Diagnosis present

## 2015-06-25 LAB — CBC WITH DIFFERENTIAL (CANCER CENTER ONLY)
BASO#: 0 10*3/uL (ref 0.0–0.2)
BASO%: 0.6 % (ref 0.0–2.0)
EOS%: 6.3 % (ref 0.0–7.0)
Eosinophils Absolute: 0.4 10*3/uL (ref 0.0–0.5)
HEMATOCRIT: 28.4 % — AB (ref 34.8–46.6)
HEMOGLOBIN: 9.2 g/dL — AB (ref 11.6–15.9)
LYMPH#: 1.3 10*3/uL (ref 0.9–3.3)
LYMPH%: 19.6 % (ref 14.0–48.0)
MCH: 30.6 pg (ref 26.0–34.0)
MCHC: 32.4 g/dL (ref 32.0–36.0)
MCV: 94 fL (ref 81–101)
MONO#: 0.6 10*3/uL (ref 0.1–0.9)
MONO%: 9.4 % (ref 0.0–13.0)
NEUT%: 64.1 % (ref 39.6–80.0)
NEUTROS ABS: 4.1 10*3/uL (ref 1.5–6.5)
Platelets: 126 10*3/uL — ABNORMAL LOW (ref 145–400)
RBC: 3.01 10*6/uL — AB (ref 3.70–5.32)
RDW: 13.9 % (ref 11.1–15.7)
WBC: 6.4 10*3/uL (ref 3.9–10.0)

## 2015-06-25 LAB — COMPREHENSIVE METABOLIC PANEL
AST: 19 U/L (ref 5–34)
Albumin: 3.7 g/dL (ref 3.5–5.0)
Alkaline Phosphatase: 66 U/L (ref 40–150)
Anion Gap: 9 mEq/L (ref 3–11)
BILIRUBIN TOTAL: 0.51 mg/dL (ref 0.20–1.20)
BUN: 24.4 mg/dL (ref 7.0–26.0)
CO2: 28 meq/L (ref 22–29)
Calcium: 9 mg/dL (ref 8.4–10.4)
Chloride: 103 mEq/L (ref 98–109)
Creatinine: 1.4 mg/dL — ABNORMAL HIGH (ref 0.6–1.1)
EGFR: 31 mL/min/{1.73_m2} — AB (ref >90)
GLUCOSE: 109 mg/dL (ref 70–140)
Potassium: 3.8 mEq/L (ref 3.5–5.1)
SODIUM: 140 meq/L (ref 136–145)
TOTAL PROTEIN: 6.8 g/dL (ref 6.4–8.3)

## 2015-06-25 LAB — IRON AND TIBC
%SAT: 41 % (ref 21–57)
IRON: 88 ug/dL (ref 41–142)
TIBC: 216 ug/dL — ABNORMAL LOW (ref 236–444)
UIBC: 129 ug/dL (ref 120–384)

## 2015-06-25 LAB — PROTEIN ELECTROPHORESIS, SERUM, WITH REFLEX
Albumin ELP: 3.6 g/dL — ABNORMAL LOW (ref 3.8–4.8)
Alpha-1-Globulin: 0.3 g/dL (ref 0.2–0.3)
Alpha-2-Globulin: 0.7 g/dL (ref 0.5–0.9)
BETA 2: 0.3 g/dL (ref 0.2–0.5)
BETA GLOBULIN: 0.3 g/dL — AB (ref 0.4–0.6)
Gamma Globulin: 1.1 g/dL (ref 0.8–1.7)
TOTAL PROTEIN, SERUM ELECTROPHOR: 6.3 g/dL (ref 6.1–8.1)

## 2015-06-25 LAB — FERRITIN: Ferritin: 214 ng/ml (ref 9–269)

## 2015-06-25 LAB — CHCC SATELLITE - SMEAR

## 2015-06-26 LAB — ERYTHROPOIETIN: ERYTHROPOIETIN: 10.7 m[IU]/mL (ref 2.6–18.5)

## 2015-06-26 LAB — VITAMIN B12: VITAMIN B 12: 948 pg/mL — AB (ref 211–946)

## 2015-06-26 LAB — RETICULOCYTES: RETICULOCYTE COUNT: 1.1 % (ref 0.6–2.6)

## 2015-06-27 NOTE — Progress Notes (Signed)
Referral MD  Reason for Referral: Chronic anemia and mild thrombocytopenia   Chief Complaint  Patient presents with  . OTHER    New Patient  : The patient is not sure as to why she is with [place].  HPI: Jordan Barker is a very charming 82 year old white female. She has only hypertension. She is on chronic oxygen therapy. She also has some congestive heart failure. She has valvular issues with both mitral and tricuspid valve.  She's been noted to have issues with anemia. Going back to June 2016, her white cell count was 5.7. Hemoglobin 9.6 and platelet count 130,000. MCV was 94. She has mild renal insufficiency. Her creatinine back in July last year was 1.32. Her GFR was only 40 ML/minute.  She's had an elevated pro-beta natruretic peptide.  She has had an SPEP done. This patient did not show any obvious monoclonal spike although a "possibility of a faint restricted band" cannot be excluded. She has had normal immunoglobulin levels. Again, immunofixation showed the possibility of a IgG kappa spike.   Her family doctor was concerned about her anemia. Again she'll also has some mild thrombocytopenia.  She's had no bleeding. She is only taking baby aspirin. She is on some blood pressure medications.  She's had no issue with weight loss or weight gain. He's had no thyroid problems. There is no issues with anemia and her family area and she's had no skin problems. She's had no joint issues area  Overall, her performance status is ECOG 2.     Past Medical History  Diagnosis Date  . On home oxygen therapy     Commencing 05/2014   . Moderate to severe pulmonary hypertension (HCC)     Echo 05/2014 PASP 61 mmHg   . Essential hypertension   . Chronic diastolic CHF (congestive heart failure) (HCC)   . OSA (obstructive sleep apnea)   . Mitral regurgitation     a. Echo 05/2014 - mild-mod MR.  . Tricuspid regurgitation     a. Echo 05/2014 - mod-sev TR.  Marland Kitchen Pulmonary nodule     a. Followed by pulm.  .  Chronic respiratory failure (HCC)   . Interstitial lung disease (HCC)   . CKD (chronic kidney disease), stage III   :  No past surgical history on file.:   Current outpatient prescriptions:  .  acetaZOLAMIDE (DIAMOX) 250 MG tablet, TAKE 1 TABLET(250 MG) BY MOUTH DAILY, Disp: 30 tablet, Rfl: 3 .  aspirin 81 MG tablet, Take 81 mg by mouth daily as needed (pain). , Disp: , Rfl:  .  beta carotene w/minerals (OCUVITE) tablet, Take 1 tablet by mouth daily., Disp: , Rfl:  .  doxazosin (CARDURA) 2 MG tablet, Take 1 mg by mouth 2 (two) times daily. , Disp: , Rfl:  .  furosemide (LASIX) 20 MG tablet, Take 2 tablets (40 mg total) by mouth daily., Disp: 60 tablet, Rfl: 1 .  lisinopril (PRINIVIL,ZESTRIL) 40 MG tablet, Take 40 mg by mouth daily., Disp: , Rfl:  .  metoprolol succinate (TOPROL-XL) 100 MG 24 hr tablet, Take 0.5 tablets by mouth 2 (two) times daily., Disp: , Rfl:  .  omeprazole (PRILOSEC) 20 MG capsule, Take 20 mg by mouth daily., Disp: , Rfl:  .  OXYGEN, as directed. 2L/MIN BY CANNULA, Disp: , Rfl:  .  Vitamin Mixture (VITAMIN E COMPLETE) CAPS, Take 1 capsule by mouth daily as needed (DIZZINESS)., Disp: , Rfl: :  :  Allergies  Allergen Reactions  . Amlodipine  Shortness Of Breath and Other (See Comments)    REACTION: "hot/cold flashes"  . Celecoxib Anxiety and Rash  . Chlorthalidone Anxiety and Rash  . Hydrochlorothiazide Anxiety and Rash  . Sulfa Antibiotics Other (See Comments)    REACTION: "UNKNOWN...BEEN SO LONG AGO"  :  Family History  Problem Relation Age of Onset  . Hypertension Mother   . Ovarian cancer Mother   . Hypertension Father   . Parkinson's disease Father   . Heart disease Brother   . Cancer Brother   . Parkinson's disease Brother   . Prostate cancer Brother   . Heart disease Brother   . Hypertension Brother   . Congestive Heart Failure Brother   . Bone cancer Brother   :  Social History   Social History  . Marital Status: Divorced    Spouse Name:  N/A  . Number of Children: N/A  . Years of Education: N/A   Occupational History  . Not on file.   Social History Main Topics  . Smoking status: Former Smoker -- 0.50 packs/day for 2 years    Types: Cigarettes    Quit date: 10/16/1946  . Smokeless tobacco: Not on file  . Alcohol Use: Not on file  . Drug Use: Not on file  . Sexual Activity: Not on file   Other Topics Concern  . Not on file   Social History Narrative  :  Pertinent items are noted in HPI.  Exam: @ Elderly but well-nourished white female in no obvious distress. Vital signs showed temperature of 97.8. Pulse 61. Blood pressure 130/50. Weight is 145 pounds. Head and neck exam shows no ocular or oral lesions. She has no scleral icterus. Conjunctiva are pink. She has no palpable thyroid. There is no adenopathy in the neck. Lungs are clear. Cardiac exam regular rate and rhythm. She has a 2/6 systolic ejection murmur. Abdomen is soft. She has good bowel sounds. There is no fluid wave. There is no palpable liver or spleen tip. Back exam shows no kyphosis. Shows no tenderness over the spine, ribs or hips. Extremities shows some age related osteoarthritic changes. She has decent strength. Skin exam shows no rashes, ecchymoses or petechia. Neurological exam shows no focal neurological deficits.    Recent Labs  06/25/15 1019  WBC 6.4  HGB 9.2*  HCT 28.4*  PLT 126*    Recent Labs  06/25/15 1020  NA 140  K 3.8  CO2 28  GLUCOSE 109  BUN 24.4  CREATININE 1.4*  CALCIUM 9.0    Blood smear review: Normochromic and normocytic population of red blood cells. There is some slight anisocytosis. She has some microcytic red cells. She has no nucleated red cells. There is no teardrop cells. I see no target cells. Shows no rouleau formation. There are no schistocytes or spherocytes. White cells are normal in morphology maturation. There is no immature myeloid or lymphoid form. There are no hypersegmented polys. Platelets  are adequate in number and size. She has good maturation of her platelets.  Pathology: None     Assessment and Plan:  Jordan Barker is a 82 year old white female. She has mild anemia. She is asymptomatic with this. She is on chronic oxygen.  Her studies show that her erythropoietin level is only 10.7. I think that this is a real problem. Her iron studies don't look too bad. Her ferritin is 214 with iron saturation of 41%.  Again I suspect that she with her chronic renal insufficiency, she has the erythropoietin  deficiency. I would think that with supplemental  ESA that we get her blood count up better.  She can with her son. They're both very nice. I probably will have to talk to him about doing the Aranesp.  For now, I will get things set up with Aranesp.  We'll try to get started in a week or so.  Ultimately, her blood count really is not all that bad so I'll think that it's can be imperative that we get started immediately.  I spent about 45 minutes with she and her son.

## 2015-06-27 NOTE — Progress Notes (Signed)
Quick Note:  LMOM TCB x1. ______ 

## 2015-06-30 ENCOUNTER — Telehealth: Payer: Self-pay | Admitting: Pulmonary Disease

## 2015-06-30 NOTE — Telephone Encounter (Signed)
  Notes Recorded by Michele McalpineScott M Nadel, MD on 06/27/2015 at 4:55 PM Please notify patient&husb> Labs look OK- Chems are stable w/ sl elev BS at 138 (rec low carb diet) and sl elev Creat=1.44 (mild renal insuffic)... CBC w/ persistent anemia & Drennever did additional tests recently that he will discuss w/ you... REC- continue same meds for now, no salt in diet, etc... Notes Recorded by Michele McalpineScott M Nadel, MD on 06/27/2015 at 4:51 PM Please notify patient/husb> CXR is stable- mild hyperinflation, stable 6mm nodule at right base, atherosclerotic changes and degen changes in TSpine w/ osteopenia; NAD/ Stable...  Results have been explained to patient son, expressed understanding.  Pt son asking if the renal issues could be coming from the fact that the patient has been taking a lot of fluid pills recently? Please advise Dr Kriste BasqueNadel. Thanks.

## 2015-06-30 NOTE — Telephone Encounter (Signed)
Per SN: Too many diuretics can cause worsening renal insufficiency, but its a fine line. Rec continue current meds the same. Appears optimal at this point.   Called and spoke with Patient Son, aware of recs. Nothing further needed.

## 2015-07-03 ENCOUNTER — Ambulatory Visit (INDEPENDENT_AMBULATORY_CARE_PROVIDER_SITE_OTHER): Payer: Medicare Other | Admitting: Interventional Cardiology

## 2015-07-03 ENCOUNTER — Ambulatory Visit (INDEPENDENT_AMBULATORY_CARE_PROVIDER_SITE_OTHER)
Admission: RE | Admit: 2015-07-03 | Discharge: 2015-07-03 | Disposition: A | Discharge status: Home / Self Care | Payer: Medicare Other | Source: Ambulatory Visit | Attending: Pulmonary Disease | Admitting: Pulmonary Disease

## 2015-07-03 ENCOUNTER — Encounter: Payer: Self-pay | Admitting: Interventional Cardiology

## 2015-07-03 VITALS — BP 150/62 | HR 61 | Ht 64.0 in | Wt 147.8 lb

## 2015-07-03 DIAGNOSIS — R911 Solitary pulmonary nodule: Secondary | ICD-10-CM

## 2015-07-03 DIAGNOSIS — G4733 Obstructive sleep apnea (adult) (pediatric): Secondary | ICD-10-CM | POA: Diagnosis not present

## 2015-07-03 DIAGNOSIS — N183 Chronic kidney disease, stage 3 unspecified: Secondary | ICD-10-CM

## 2015-07-03 DIAGNOSIS — I5032 Chronic diastolic (congestive) heart failure: Secondary | ICD-10-CM

## 2015-07-03 DIAGNOSIS — I272 Other secondary pulmonary hypertension: Secondary | ICD-10-CM | POA: Diagnosis not present

## 2015-07-03 DIAGNOSIS — Z9981 Dependence on supplemental oxygen: Secondary | ICD-10-CM

## 2015-07-03 DIAGNOSIS — I10 Essential (primary) hypertension: Secondary | ICD-10-CM | POA: Diagnosis not present

## 2015-07-03 NOTE — Progress Notes (Signed)
Cardiology Office Note   Date:  07/03/2015   ID:  Jordan Barker, DOB 1919-04-17, MRN [id number]  PCP:  Ardyth Gal, MD  Cardiologist:  Lesleigh Noe, MD   Chief Complaint  Patient presents with  . Congestive Heart Failure      History of Present Illness: Jordan Barker is a 82 y.o. female who presents for Pulmonary hypertension, diastolic heart disease, pulmonary hypertension, obstructive sleep apnea, and pulmonary nodule.  Wearing chronic home O2. Denies orthopnea and PND. Lower extremity edema has resolved. No episodes of syncope or palpitations.    Past Medical History  Diagnosis Date  . On home oxygen therapy     Commencing 05/2014   . Moderate to severe pulmonary hypertension (HCC)     Echo 05/2014 PASP 61 mmHg   . Essential hypertension   . Chronic diastolic CHF (congestive heart failure) (HCC)   . OSA (obstructive sleep apnea)   . Mitral regurgitation     a. Echo 05/2014 - mild-mod MR.  . Tricuspid regurgitation     a. Echo 05/2014 - mod-sev TR.  Marland Kitchen Pulmonary nodule     a. Followed by pulm.  . Chronic respiratory failure (HCC)   . Interstitial lung disease (HCC)   . CKD (chronic kidney disease), stage III     No past surgical history on file.   Current Outpatient Prescriptions  Medication Sig Dispense Refill  . acetaZOLAMIDE (DIAMOX) 250 MG tablet TAKE 1 TABLET(250 MG) BY MOUTH DAILY 30 tablet 3  . aspirin 81 MG tablet Take 81 mg by mouth daily as needed (pain).     Marland Kitchen beta carotene w/minerals (OCUVITE) tablet Take 1 tablet by mouth daily.    Marland Kitchen doxazosin (CARDURA) 2 MG tablet Take 1 mg by mouth 2 (two) times daily.     . furosemide (LASIX) 20 MG tablet Take 2 tablets (40 mg total) by mouth daily. 60 tablet 1  . lisinopril (PRINIVIL,ZESTRIL) 40 MG tablet Take 40 mg by mouth daily.    . metoprolol succinate (TOPROL-XL) 100 MG 24 hr tablet Take 0.5 tablets by mouth 2 (two) times daily.    Marland Kitchen omeprazole (PRILOSEC) 20 MG capsule Take 20 mg by mouth daily.    . OXYGEN  as directed. 2L/MIN BY CANNULA    . Vitamin Mixture (VITAMIN E COMPLETE) CAPS Take 1 capsule by mouth daily as needed (DIZZINESS).     No current facility-administered medications for this visit.    Allergies:   Amlodipine; Celecoxib; Chlorthalidone; Hydrochlorothiazide; and Sulfa antibiotics    Social History:  The patient  reports that she quit smoking about 68 years ago. Her smoking use included Cigarettes. She has a 1 pack-year smoking history. She has never used smokeless tobacco.   Family History:  The patient's family history includes Bone cancer in her brother; Cancer in her brother; Congestive Heart Failure in her brother; Heart disease in her brother and brother; Hypertension in her brother, father, and mother; Ovarian cancer in her mother; Parkinson's disease in her brother and father; Prostate cancer in her brother.    ROS:  Please see the history of present illness.   Otherwise, review of systems are positive for shortness of breath, dizziness, easy bruising, back pain, leg pain, and DOE..   All other systems are reviewed and negative.    PHYSICAL EXAM: VS:  BP 150/62 mmHg  Pulse 61  Ht  (1.626 m)  Wt 147 lb 12.8 oz (67.042 kg)  BMI 25.36 kg/m2 , BMI Body  mass index is 25.36 kg/(m^2). GEN: Well nourished, well developed, in no acute distressPeriod pale appearing HEENT: normal Neck: no JVD, carotid bruits, or masses Cardiac: RRR.  There is no murmur, rub, or gallop. There is no edema. Respiratory:  clear to auscultation bilaterally, normal work of breathing. GI: soft, nontender, nondistended, + BS MS: no deformity or atrophy Skin: warm and dry, no rash Neuro:  Strength and sensation are intact Psych: euthymic mood, full affect   EKG:  EKG is not ordered today.   Recent Labs: 10/16/2014: TSH 2.04 06/23/2015: Pro B Natriuretic peptide (BNP) 643.0* 06/25/2015: ALT <9; BUN 24.4; Creatinine 1.4*; HGB 9.2*; Platelets 126*; Potassium 3.8; Sodium 140    Lipid Panel No  results found for: CHOL, TRIG, HDL, CHOLHDL, VLDL, LDLCALC, LDLDIRECT    Wt Readings from Last 3 Encounters:  07/03/15 147 lb 12.8 oz (67.042 kg)  06/25/15 145 lb (65.772 kg)  06/23/15 146 lb 6.4 oz (66.407 kg)      Other studies Reviewed: Additional studies/ records that were reviewed today include: Reviewed hematology and pulmonary notes.. The findings include patient is seeing both DrEnnerver and Kriste BasqueNadel..    ASSESSMENT AND PLAN:  1. Moderate to severe pulmonary hypertension (HCC) Being treated with oxygen therapy  2. Chronic diastolic CHF (congestive heart failure) (HCC) Stable on low-dose diuretic therapy without volume overload  3. Sleep apnea, obstructive Compliant with C Pap  4. Essential hypertension About elevation in systolic pressure. Given age I would not further increase therapy  5. CKD (chronic kidney disease), stage III Stable  6. On home oxygen therapy Stable    Current medicines are reviewed at length with the patient today.  The patient has the following concerns regarding medicines: No change.  The following changes/actions have been instituted:    Encourage compliant with C Pap and chronic O2 therapy  Labs/ tests ordered today include:  No orders of the defined types were placed in this encounter.     Disposition:   FU with HS in 1 year  Signed, Lesleigh NoeSMITH III,HENRY W, MD  07/03/2015 10:29 AM    [facility] [address] [place], [place]  [address] Phone: [phone]; Fax: [phone]

## 2015-07-03 NOTE — Patient Instructions (Signed)

## 2015-07-13 ENCOUNTER — Other Ambulatory Visit: Payer: Self-pay | Admitting: Pulmonary Disease

## 2015-08-05 ENCOUNTER — Ambulatory Visit (INDEPENDENT_AMBULATORY_CARE_PROVIDER_SITE_OTHER): Payer: Medicare Other | Admitting: Cardiology

## 2015-08-05 ENCOUNTER — Encounter: Payer: Self-pay | Admitting: Cardiology

## 2015-08-05 VITALS — BP 130/74 | HR 64 | Ht 64.0 in | Wt 145.1 lb

## 2015-08-05 DIAGNOSIS — G4733 Obstructive sleep apnea (adult) (pediatric): Secondary | ICD-10-CM

## 2015-08-05 DIAGNOSIS — I10 Essential (primary) hypertension: Secondary | ICD-10-CM

## 2015-08-05 NOTE — Progress Notes (Signed)
Cardiology Office Note    Date:  08/05/2015   ID:  Jordan Barker, DOB May 26, 1918, MRN [id number]  PCP:  Ardyth Gal, MD  Cardiologist:  Quintella Reichert, MD   Chief Complaint  Patient presents with  . Sleep Apnea  . Hypertension    History of Present Illness:  Jordan Barker is a 82 y.o. female who presents for followup of sleep apnea. She was referred for PSG due to pulmonary HTN and excessive daytime sleepiness with an Epworth sleepiness sore of 11. She underwent PSG showing mild to moderate OSA with an AHI of 14.4/hr with all events occurring in the supine position during REM sleep. She had severe oxygen desaturations as low as 73% with respiratory events. She is now o n CPAP at  5cm H2O. She is doing well with her CPAP device. She tolerates her full face mask and feels the pressure is adequate. Since starting CPAP she has noticed improvement in daytime sleepiness and feels more rested in the am if she has slept well the night before. She does complain of dry mouth in the morning.       Past Medical History  Diagnosis Date  . On home oxygen therapy     Commencing 05/2014   . Moderate to severe pulmonary hypertension (HCC)     Echo 05/2014 PASP 61 mmHg   . Essential hypertension   . Chronic diastolic CHF (congestive heart failure) (HCC)   . OSA (obstructive sleep apnea)   . Mitral regurgitation     a. Echo 05/2014 - mild-mod MR.  . Tricuspid regurgitation     a. Echo 05/2014 - mod-sev TR.  Marland Kitchen Pulmonary nodule     a. Followed by pulm.  . Chronic respiratory failure (HCC)   . Interstitial lung disease (HCC)   . CKD (chronic kidney disease), stage III     No past surgical history on file.  Current Medications: Outpatient Prescriptions Prior to Visit  Medication Sig Dispense Refill  . acetaZOLAMIDE (DIAMOX) 250 MG tablet TAKE 1 TABLET(250 MG) BY MOUTH DAILY 30 tablet 3  . aspirin 81 MG tablet Take 81 mg by mouth daily as needed (pain).     Marland Kitchen beta carotene w/minerals (OCUVITE)  tablet Take 1 tablet by mouth daily.    Marland Kitchen doxazosin (CARDURA) 2 MG tablet Take 1 mg by mouth 2 (two) times daily.     . furosemide (LASIX) 20 MG tablet TAKE 2 TABLETS(40 MG) BY MOUTH DAILY 180 tablet 0  . lisinopril (PRINIVIL,ZESTRIL) 40 MG tablet Take 40 mg by mouth daily.    . metoprolol succinate (TOPROL-XL) 100 MG 24 hr tablet Take 0.5 tablets by mouth 2 (two) times daily.    Marland Kitchen omeprazole (PRILOSEC) 20 MG capsule Take 20 mg by mouth daily.    . OXYGEN as directed. 2L/MIN BY CANNULA    . Vitamin Mixture (VITAMIN E COMPLETE) CAPS Take 1 capsule by mouth daily as needed (DIZZINESS).     No facility-administered medications prior to visit.     Allergies:   Amlodipine; Celecoxib; Chlorthalidone; Hydrochlorothiazide; and Sulfa antibiotics   Social History   Social History  . Marital Status: Divorced    Spouse Name: N/A  . Number of Children: N/A  . Years of Education: N/A   Social History Main Topics  . Smoking status: Former Smoker -- 0.50 packs/day for 2 years    Types: Cigarettes    Quit date: 10/16/1946  . Smokeless tobacco: Never Used  . Alcohol Use: None  .  Drug Use: None  . Sexual Activity: Not Asked   Other Topics Concern  . None   Social History Narrative     Family History:  The patient's family history includes Bone cancer in her brother; Cancer in her brother; Congestive Heart Failure in her brother; Heart disease in her brother and brother; Hypertension in her brother, father, and mother; Ovarian cancer in her mother; Parkinson's disease in her brother and father; Prostate cancer in her brother.   ROS:   Please see the history of present illness.    Review of Systems  Constitution: Negative.  HENT: Negative.   Eyes: Negative.   Cardiovascular: Negative.   Respiratory: Negative.   Skin: Negative.   Musculoskeletal: Positive for back pain.  Gastrointestinal: Negative.   Genitourinary: Negative.   Neurological: Negative.   Psychiatric/Behavioral: Negative.     All other systems reviewed and are negative.   PHYSICAL EXAM:   VS:  BP 130/74 mmHg  Pulse 64  Ht 5\' 4"  (1.626 m)  Wt 145 lb 1.9 oz (65.826 kg)  BMI 24.90 kg/m2   GEN: Well nourished, well developed, in no acute distress HEENT: normal Neck: no JVD, carotid bruits, or masses Cardiac: RRR; no murmurs, rubs, or gallops,no edema.  Intact distal pulses bilaterally.  Respiratory:  clear to auscultation bilaterally, normal work of breathing GI: soft, nontender, nondistended, + BS MS: no deformity or atrophy Skin: warm and dry, no rash Neuro:  Alert and Oriented x 3, Strength and sensation are intact Psych: euthymic mood, full affect  Wt Readings from Last 3 Encounters:  08/05/15 145 lb 1.9 oz (65.826 kg)  07/03/15 147 lb 12.8 oz (67.042 kg)  06/25/15 145 lb (65.772 kg)      Studies/Labs Reviewed:   EKG:  EKG is not ordered today.    Recent Labs: 10/16/2014: TSH 2.04 06/23/2015: Pro B Natriuretic peptide (BNP) 643.0* 06/25/2015: ALT <9; BUN 24.4; Creatinine 1.4*; HGB 9.2*; Platelets 126*; Potassium 3.8; Sodium 140   Lipid Panel No results found for: CHOL, TRIG, HDL, CHOLHDL, VLDL, LDLCALC, LDLDIRECT  Additional studies/ records that were reviewed today include:  CPAP d/l    ASSESSMENT:    1. Sleep apnea, obstructive   2. Essential hypertension      PLAN:  In order of problems listed above:  1. OSA doing well on her CPAP.  Patient has been using and benefiting from CPAP use and will continue to benefit from therapy.Her d/l today showed an AHI of 2.1/hr on CPAP of 5cm H2O with 83% compliance in using more than 4 hours nightly.  She will continue on current settings.  I encouraged her to adjust her humidity dial on her machine to try to resolve her mouth dryness. 2. HTN - BP is well controlled.  Continue BB and ACE I.    Medication Adjustments/Labs and Tests Ordered: Current medicines are reviewed at length with the patient today.  Concerns regarding medicines are  outlined above.  Medication changes, Labs and Tests ordered today are listed in the Patient Instructions below. There are no Patient Instructions on file for this visit.   Harlon FlorSigned, Adylin Hankey R, MD  08/05/2015 10:01 AM    [facility] [address], [place], [place]  [address] Phone: [phone]([phone]; Fax: [phone]

## 2015-08-05 NOTE — Patient Instructions (Signed)

## 2015-08-07 ENCOUNTER — Encounter: Payer: Self-pay | Admitting: Cardiology

## 2015-08-25 ENCOUNTER — Other Ambulatory Visit: Payer: Self-pay | Admitting: Pulmonary Disease

## 2015-09-30 ENCOUNTER — Other Ambulatory Visit: Payer: Self-pay | Admitting: Pulmonary Disease

## 2015-10-02 NOTE — Telephone Encounter (Signed)
Diamox is not on pt's med list.  Deferred to PCP

## 2015-10-09 ENCOUNTER — Other Ambulatory Visit: Payer: Self-pay | Admitting: Pulmonary Disease

## 2015-10-23 ENCOUNTER — Other Ambulatory Visit: Payer: Self-pay | Admitting: Pulmonary Disease

## 2015-11-16 ENCOUNTER — Other Ambulatory Visit: Payer: Self-pay | Admitting: Pulmonary Disease

## 2015-12-12 ENCOUNTER — Other Ambulatory Visit: Payer: Self-pay | Admitting: Physical Medicine and Rehabilitation

## 2015-12-12 DIAGNOSIS — S32000A Wedge compression fracture of unspecified lumbar vertebra, initial encounter for closed fracture: Secondary | ICD-10-CM

## 2015-12-24 ENCOUNTER — Other Ambulatory Visit: Payer: Self-pay | Admitting: Interventional Radiology

## 2015-12-24 ENCOUNTER — Ambulatory Visit
Admission: RE | Admit: 2015-12-24 | Discharge: 2015-12-24 | Disposition: A | Discharge status: Home / Self Care | Payer: Medicare Other | Source: Ambulatory Visit | Attending: Physical Medicine and Rehabilitation | Admitting: Physical Medicine and Rehabilitation

## 2015-12-24 ENCOUNTER — Ambulatory Visit: Payer: Medicare Other | Admitting: Pulmonary Disease

## 2015-12-24 DIAGNOSIS — S32000A Wedge compression fracture of unspecified lumbar vertebra, initial encounter for closed fracture: Secondary | ICD-10-CM

## 2015-12-24 HISTORY — PX: IR GENERIC HISTORICAL: IMG1180011

## 2015-12-24 NOTE — Consult Note (Signed)
Chief Complaint: Patient was seen in consultation today for  Chief Complaint  Patient presents with  . Advice Only    Consult for Kyphoplasty of L3 Compression Fracture   at the request of Prueter,Karen  Referring Physician(s): Prueter,Karen  Supervising Physician: Oley BalmHassell, Charmaine Placido  Patient Status: Outpatient  History of Present Illness: Jordan Barker is a 82 y.o. female   with bilateral low back pain, and a L3 compression fracture undetermined in age. She also has a old compression fracture at L5. She describes a fall about 6 months ago. Serial imaging shows progressive loss of height at the L3 subacute compression fracture. Her pain is incompletely controlled by by mouth aspirin and Lidoderm patches. She does not tolerate narcotic level pain medication because of neurologic side effects. Previously, she walked without assistance. Now she walks with significant difficulty using a cane, with decreased mobility. She has since tenths stabbing right leg and hip pain as well. She rates her 10 7 out of 10 on the visual analog pain scale at its best. She rates 16/24 on the [employer] disability questionnaire. Neurosurgery did not recommend open surgical fixation.   The patient is on oxygen 24/7, she also has cardiac disease . Her primary care physician has already started her on bone building regimen.  Past Medical History:  Diagnosis Date  . Chronic diastolic CHF (congestive heart failure) (HCC)   . Chronic respiratory failure (HCC)   . CKD (chronic kidney disease), stage III   . Essential hypertension   . Interstitial lung disease (HCC)   . Mitral regurgitation    a. Echo 05/2014 - mild-mod MR.  . Moderate to severe pulmonary hypertension (HCC)    Echo 05/2014 PASP 61 mmHg   . On home oxygen therapy    Commencing 05/2014   . OSA (obstructive sleep apnea)   . Pulmonary nodule    a. Followed by pulm.  . Tricuspid regurgitation    a. Echo 05/2014 - mod-sev TR.    No past surgical  history on file.  Allergies: Amlodipine; Celecoxib; Chlorthalidone; Hydrochlorothiazide; and Sulfa antibiotics  Medications: Prior to Admission medications   Medication Sig Start Date End Date Taking? Authorizing Provider  alendronate (FOSAMAX) 70 MG tablet Take 70 mg by mouth once a week. Take with a full glass of water on an empty stomach.  Takes on Saturdays.   Yes Historical Provider, MD  aspirin 81 MG tablet Take 81 mg by mouth daily as needed (pain).    Yes Historical Provider, MD  beta carotene w/minerals (OCUVITE) tablet Take 1 tablet by mouth daily. 03/23/13  Yes Historical Provider, MD  calcium-vitamin D (OSCAL WITH D) 250-125 MG-UNIT tablet Take 1 tablet by mouth 2 (two) times daily. Takes twice daily with meals.   Yes Historical Provider, MD  doxazosin (CARDURA) 2 MG tablet Take 1 mg by mouth 2 (two) times daily.    Yes Historical Provider, MD  furosemide (LASIX) 20 MG tablet TAKE 2 TABLETS(40 MG) BY MOUTH DAILY 10/09/15  Yes Michele McalpineScott M Nadel, MD  lisinopril (PRINIVIL,ZESTRIL) 40 MG tablet Take 40 mg by mouth daily. 06/20/14  Yes Historical Provider, MD  metoprolol succinate (TOPROL-XL) 100 MG 24 hr tablet Take 0.5 tablets by mouth 2 (two) times daily. 12/30/14  Yes Historical Provider, MD  omeprazole (PRILOSEC) 20 MG capsule Take 20 mg by mouth daily. 02/26/14  Yes Historical Provider, MD  OXYGEN as directed. 2L/MIN BY CANNULA   Yes Historical Provider, MD  Vitamin Mixture (VITAMIN  E COMPLETE) CAPS Take 1 capsule by mouth daily as needed (DIZZINESS).   Yes Historical Provider, MD  acetaZOLAMIDE (DIAMOX) 250 MG tablet TAKE 1 TABLET(250 MG) BY MOUTH DAILY Patient not taking: Reported on 12/24/2015 11/17/15   Michele Mcalpine, MD     Family History  Problem Relation Age of Onset  . Hypertension Mother   . Ovarian cancer Mother   . Hypertension Father   . Parkinson's disease Father   . Heart disease Brother   . Cancer Brother   . Parkinson's disease Brother   . Prostate cancer Brother   .  Heart disease Brother   . Hypertension Brother   . Congestive Heart Failure Brother   . Bone cancer Brother     Social History   Social History  . Marital status: Divorced    Spouse name: N/A  . Number of children: N/A  . Years of education: N/A   Social History Main Topics  . Smoking status: Former Smoker    Packs/day: 0.50    Years: 2.00    Types: Cigarettes    Quit date: 10/16/1946  . Smokeless tobacco: Never Used  . Alcohol use None  . Drug use: Unknown  . Sexual activity: Not Asked   Other Topics Concern  . None   Social History Narrative  . None    ECOG Status: 2 - Symptomatic, <50% confined to bed  Review of Systems: A 12 point ROS discussed and pertinent positives are indicated in the HPI above.  All other systems are negative.  Review of Systems  Vital Signs: BP (!) (P) 142/62 (BP Location: Left Arm, Patient Position: Sitting, Cuff Size: Normal)   Pulse (P) 78   Temp (P) 98.5 F (36.9 C) (Oral)   Resp (P) 15   Ht 5\' 4"  (1.626 m)   Wt 142 lb (64.4 kg)   SpO2 (P) 98% Comment: O2 at 2L/Nasal Cannula  BMI 24.37 kg/m   Physical Exam  Mallampati Score:     Imaging: MRI LUMBAR SPINE WITHOUT CONTRAST  TECHNIQUE: Multiplanar, multisequence MR imaging of the lumbar spine was performed. No intravenous contrast was administered.  COMPARISON: 11/10/2015  FINDINGS: Segmentation: The lowest lumbar type non-rib-bearing vertebra is labeled as L5.  Alignment: No vertebral subluxation is observed.  Vertebrae: 50% compression fracture of L3 with 5 mm posterior bony retropulsion, in diffuse low T1 and high T2 signal throughout the L3 vertebral marrow and extending into the pedicles and right lamina. Fracture involvement of the pedicles not excluded there is no obvious subluxation. The adjacent intervertebral disc spaces appear expanded and I do not see positive evidence that this compression fracture is malignant.  There is a chronic 25% superior  endplate compression fracture at L5 with about 2 mm of chronic bony retropulsion.  8 mm probable hemangioma centrally in the T12 vertebral body.  Conus medullaris: Extends to the L1 level and appears normal.  Paraspinal and other soft tissues: Adjacent to the L3 compression fracture, paraspinal edema is noted, also with edema tracking distally in the medial psoas muscles, left greater than right.  Disc levels:  L1-2: Unremarkable.  L2-3: Moderate central narrowing of the thecal sac due to posterior bony retropulsion.  L3-4: Moderate central narrowing of the thecal sac and mild bilateral foraminal stenosis due to disc bulge, facet arthropathy, and ligamentum flavum redundancy. Bilateral facet joint effusions.  L4-5: Mild central narrowing of the thecal sac with borderline left subarticular lateral recess stenosis due to facet arthropathy and disc bulge.  Small synovial cyst posterior to the left facet joint.  L5-S1: Mild to moderate left foraminal stenosis due to disc bulge, intervertebral spurring, and facet spurring.    Impression    1. 50% compression fracture at L3 with 5 mm of posterior bony retropulsion contributing to moderate central narrowing of the thecal sac at the L2- 3 level. Involvement of the pedicles not entirely excluded ; no subluxation. There is associated paraspinal edema and edema tracking inferiorly in the medial psoas muscles adjacent to the fracture. I do not see particular characteristics to suggest a pathologic/malignant fracture. Osteoporotic compression fracture is likely. 2. Old 25% superior endplate compression fracture at L5. 3. In addition to the moderate impingement at L2- 3 due to bony retropulsion, lumbar spondylosis and degenerative disc disease cause moderate impingement at L3-4 ; mild to moderate impingement at L5-S1; and mild impingement at L4-5, as detailed above.   Electronically Signed By: Gaylyn Rong M.D. On:  11/27/2015 11:12   CLINICAL DATA: Acute compression fracture of L3. Low back pain.  EXAM: CT LUMBAR SPINE WITHOUT CONTRAST  TECHNIQUE: Multidetector CT imaging of the lumbar spine was performed without intravenous contrast administration. Multiplanar CT image reconstructions were also generated.  COMPARISON: Radiographs dated 11/10/2015 and MRI dated 11/27/2015  FINDINGS: T11-12 through L1-2: No significant abnormality.  L2-3 and L3-4: There is a benign-appearing subacute compression fracture of the L3 vertebra. The degree of compression has increased from 50% to 66% since the prior MRI. Posterior superior aspect of the L3 vertebra extends 4 mm into the spinal canal without creating severe spinal stenosis. This is unchanged.  There is no mass lesion. No epidural hematoma or disc protrusion at L2-3.  L4-5: Again noted is moderate spinal stenosis at L4-5 due to a combination of hypertrophy of the ligamentum flavum and facet joints and a broad-based disc bulge and slight spondylolisthesis.  L5-S1: Small disc bulge asymmetric into the left neural foramen without focal neural impingement. Moderate left and mild right facet arthritis.    Impression    Benign-appearing subacute compression fracture of L3, increased in severe de since 11/27/2015. Small focal protrusion of bone into the spinal canal from the posterior superior aspect of L3, unchanged.  No focal neural impingement.   Electronically Signed By: Francene Boyers M.D. On: 12/12/2015 09:28    Labs:  CBC:  Recent Labs  03/24/15 1216 06/23/15 1106 06/25/15 1019  WBC 4.7 6.0 6.4  HGB 9.8* 9.6* 9.2*  HCT 30.2* 29.0* 28.4*  PLT 125.0* 138.0* 126*    COAGS: No results for input(s): INR, APTT in the last 8760 hours.  BMP:  Recent Labs  03/24/15 1216 04/22/15 0839 06/23/15 1106 06/25/15 1020  NA 139 140 138 140  K 4.4 4.1 4.0 3.8  CL 104 105 101  --   CO2 30 30 31 28   GLUCOSE 100* 122* 138*  109  BUN 19 19 20  24.4  CALCIUM 9.3 9.2 9.3 9.0  CREATININE 1.27* 1.35* 1.44* 1.4*    LIVER FUNCTION TESTS:  Recent Labs  06/25/15 1020  BILITOT 0.51  AST 19  ALT <9  ALKPHOS 66  PROT 6.8  ALBUMIN 3.7    TUMOR MARKERS: No results for input(s): AFPTM, CEA, CA199, CHROMGRNA in the last 8760 hours.  Assessment and Plan:  My impression is that this pleasant patient has a progressive subacute unhealed L3 compression fracture deformity, probably related to underlying osteoporosis. There is no imaging suggestion of pathologic etiology. Her pain has been inadequately controlled by conservative management  and medications that she can tolerate. She cannot tolerate narcotic pain medication because of side effects. The pain limits her activities of daily living. She is clinically and anatomically an appropriate candidate for consideration of kyphoplasty or vertebroplasty. I reviewed again with the patient and her son pathophysiology of vertebral compression fracture deformities, treatment options including conservative treatment, surgical fixation, kyphoplasty or vertebroplasty. We reviewed in detail the kyphoplasty technique, anticipated benefits, time course of symptom resolution, possible risks and complications including nontarget cement deposition, and need for moderate sedation, and need for physical rehabilitation. We discussed the need for bun building therapy and risk of additional compression fractures with or without intervention. She seemed to understand and is very motivated to proceed. Accordingly, we can set this up at her convenience pending carrier approval.  Thank you for this interesting consult.  I greatly enjoyed meeting Jordan Barker and look forward to participating in their care.  A copy of this report was sent to the requesting provider on this date.  Electronically Signed: Shakeria Robinette III, DAYNE Ronique Simerly 12/24/2015, 10:21 AM   I spent a total of  40 Minutes   in face to face in  clinical consultation, greater than 50% of which was counseling/coordinating care for subacute painful lumbar L3 compression fracture deformity.

## 2015-12-29 ENCOUNTER — Encounter: Payer: Self-pay | Admitting: Pulmonary Disease

## 2015-12-29 ENCOUNTER — Ambulatory Visit: Payer: Medicare Other | Admitting: Pulmonary Disease

## 2015-12-29 VITALS — BP 132/62 | HR 65 | Temp 97.1°F | Ht 64.0 in | Wt 141.0 lb

## 2015-12-29 DIAGNOSIS — I5032 Chronic diastolic (congestive) heart failure: Secondary | ICD-10-CM

## 2015-12-29 DIAGNOSIS — R911 Solitary pulmonary nodule: Secondary | ICD-10-CM

## 2015-12-29 DIAGNOSIS — I272 Other secondary pulmonary hypertension: Secondary | ICD-10-CM

## 2015-12-29 DIAGNOSIS — G4733 Obstructive sleep apnea (adult) (pediatric): Secondary | ICD-10-CM

## 2015-12-29 DIAGNOSIS — Z23 Encounter for immunization: Secondary | ICD-10-CM

## 2015-12-29 DIAGNOSIS — I38 Endocarditis, valve unspecified: Secondary | ICD-10-CM

## 2015-12-29 NOTE — Progress Notes (Signed)
Subjective:     Patient ID: Jordan Barker, female   DOB: 12/14/1918, 82 y.o.   MRN: [id number]  HPI 82 y/o WF w/ pulmonary nodule on CT Chest, pulmonary hypertension, and hypoxemia;  She was exposed to TB yrs ago from her ex-husband and she has a POS Quantiferon Gold test w/ CXR showing pleuroparenchymal scarring in the apicies bilat;  She had hypoxemic resp failure [facility]- HBP, Valvular heart dis w/ modMR & mod to severeTR, DiastolicCHF (BNP was 8500), & PulmHTN (PASP~est60);  She now sees DrHSmith for CARDS and DrTTurner for SLEEP MED on CPAP (see below)...    ~  October 16, 2014:  Initial pulmonary consult w/ SN>>      82 y/o WF, referred by Dr. Garnette ScheuermannHank Smith (CARDS), for a pulmonary evaluation due to pulmonary nodule, pulmHTN, and hypoxemia>>  Her PCP is Dr. Ardyth GalJane Ybanez in [place] & I have reviewed the [facility]bane[facility], & DrMcGukln (Cards)...  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    Available data reveals hx of progressive dyspnea w/ chest pressure & some pedal edema, no cough/ sputum/ hemoptysis => Adm [facility] 05/2013 w/ hypoxemic RF, HBP, Valvular heart dis w/ modMR & mod to severeTR, DiastolicCHF (BNP was 8500), & PulmHTN (PASP~est60);  She was started on Oxygen, diuresed, & improved;  She had Cards f/u 07/03/14 w/ DrMcGukln in [place]> much improved w/ BP= 122/62 & w/o right heart failure signs or symptoms, asked to decr Lasix to prn edema or wt gain, continue the home O2...  [facility] showed mod LVH w/ norm LVF (EF=65-70%), no  mention of diastolic dysfunction, mild to modMR, mod to severeTR, LA&RA enlargement, norm RV size & function w/ est RVSP=5861mmHg...     She saw Dr. Garnette ScheuermannHank Smith for a cardiac second opinion 08/13/14> he felt that her mod pulmHTN was likely due to her DiastolicCHF, modMR, mod to severe TR, acute on chr diastolicCHF  (He noted no prev hx DVT, PE, OSA, connective tissue disorders, or use of diet pills);  He did further evaluation including>   EKG 08/13/14 showed SBrady, rate56, otherw norm EKG...   CT Angio Chest 08/14/14 showed > NEG for PE, mild cardiomegaly & atherosclerotic calcif in coronaries/ arch/ branch vessels, biapical pleuroparenchymal scarring + scattered scarring/atx, 6mm RLL pulm nodule, ?mosaic attenuation/ ?vasc attenuation mentioned by radiologist...   Sleep Study done 08/13/14 ([facility])> AHI=14 events per Hr (most in REM where the AHI=50 & all while supine); severe O2 desat down to 73%, few limb movements; Dr. Armanda Magicraci Turner has been consulted & ordered  CPAP titration test. EXAM reveals Afeb, VSS, O2sat=94% on O2 at 2L/min pulse dose;  HEENT- neg, mallampati1;  Chest- clear w/o w/r/r;  Heart- rr, gr1/6SEM w/o r/g;  Abd- soft, neg;  Ext- trace edema, no c/c...  CXR 10/16/14 showed heart at upper lim of norm in size, COPD/ clear lungs (nodule in RLL is not vis on CXR), NAD, +DJD in spine...   Spirometry 10/16/14 showed FVC=1.26 (60%), FEV1=0.70 (50%), %1sec=56, mid-flows  are reduced at 43% predicted; suspect combined obstructive & restrictive pattern, consider Full PFTs at a later date...   Ambulatory oxygen saturation test 10/16/14 on O2 at 2L/min pulse dose>  O2sat=94% on 2L/min pulse at rest w/ HR=78/min; she ambulated 2 Laps on her O2 w/ lowest O2sat=90% w/ HR=86/min...  LABS 6/16:  Chems- ok x HCO3=35, BUN=16, Cr=1.21, BNP=906;  CBC- Hg=9.6, MCV=93.7, Plat=130K, Sed=23;  TSH=2.04;  QuantGold= POSITIVE  Further Labs w/ Fe=99 (41%sat), Ferritin=170, B12=634, MM panel  showed norm Ig's but sl restricted mobility in IgG & Kappa lanes- suggest repeat in 54mo...   IMP/PLAN>>  Jordan Barker's symptoms would seem to place her in WHO Group 3, & the likely etiology of her pulmHTN appears multifactorial w/ cardiac dis, OSA, poss combined pulmonary dis all playing a roll;  Currently she is on O2 at 2L/min & needs to continue this;  Cardiac problems- HBP, diastolicCHF w/ edema, valvular heart dis, and extensive coronary calcif is being managed and followed by DrSmith;  She has some mild edema and BNP=906 on her Lasix20, so we discussed adding DIAMOX250mg /d for the next month w/ recheck;  OSA is being managed and followed by DrTurner;  Additionally she has a mod anemia and we will bring her back for an anemia work up to r/o other process here...   ~  November 12, 2014:  41mo ROV and Jordan Barker has lost 5# on the combination of Lasix20 & Diamox250; she states that he breathing is about the same- no real change noted (walking is ok indoors but notes same SOB when walking outside), she denies CP/ pressure but is c/o some back discomfort in upper back which she blames on slumping posture... 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    6mm RLL pulmonary nodule on CT Chest>  This is asymptomatic and incidental, we will plan f/u CT in 6 months.    Pulmonary HTN w/ PASP~60 by 2DEcho in [place] 2/16> on O2 at 2L/min 24/7; this is most likely multifactorial secondary to valvular heart dis, diastolic CHF, OSA, combined obstructive & restrictive lung dis...     Hypoxemia> treated w/ home O2 at 2L/min 24/7...    OSA> sleep study done by DrSmith/ DrTurner & CPAP titration study pending; she needs to get started on CPAP ASAP w/ documentation of efficacy & no nocturnal hypoxemia as part of her PulmHTN protocol...    Pos  Quantiferon Gold> w/ hx remote Tb exposure, NEG CXR- no active dis...    Extensive coronary calcifications> on ASA81 and followed by DrHSmith...    Valvular heart disease w/ modMR & TR> followed by DrHSmith for Cards...    Diastolic CHF> on [id number], Lisin40, Cardura2Bid, Lasix20, Diamox250; Labs today showed BUN=22, Cr=1.32, K=4.2, BNP=866    HBP> controlled on meds above & well contrlled w/ BP= 136/60 today, denies CP/ palpit/ edema...    Anemia> we did prelim eval w/ Hg=9.6, MCV=94, Sed=23, Fe=99 (41%sat), Ferritin=170, B12=634, SPE/IEP showed no M-spike, but area of slightly restricted mobility in the IgG and Kappa lanes noted- plan repeat in 54mo EXAM reveals Afeb, VSS, O2sat=90% on O2 at 2L/min pulse dose;  HEENT- neg, mallampati1;  Chest- clear w/o w/r/r;  Heart- rr, gr1/6SEM w/o r/g;  Abd- soft, neg;  Ext- trace edema, no c/c.Marland KitchenMarland Kitchen  LABS 7/16:  Chems- ok w/ BUN=22, Cr=1.32, K=4.2, but BNP=866 (prev 906) IMP/PLAN>>  She will continue the O2 at 2L/min 24/7;  I am reluctant to push the diuretics any further w/ Cr=1.32 but BNP remains elev;  We will add ANORO one puff daily as a trial;  She needs ROV w/ Cards- DrHSmith & has yet to see DrTurner for her OSA- CPAP titration test pending;  Ultimately when all treatments in place & optimized they will want to recheck 2DEcho to see if PASP has improved, otherw might be a candidate for RHC & PH therapy despite her age...    ~  September 9. 2016:  6wk ROV & when last seen we added ANORO one inhalation daily but she states that she "didn't like it" and only used it once or twice then through out the sample "it's not worth it to me";  She hasn't yet followed up w/ her Cardiologist- DrHSmith, & hasn't seen her Sleep doctor- DrTTurner yet despite being on CPAP for the last 3wks she says...    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    6mm  RLL pulmonary nodule on CT Chest>  This is asymptomatic and incidental, we will plan f/u CT at her next visit...    Pulmonary HTN w/ PASP~60 by 2DEcho in [place] 2/16> on O2 at 2L/min 24/7; this is most likely multifactorial secondary to valvular heart dis, diastolic CHF, OSA, combined obstructive & restrictive lung dis...     Hypoxemia> treated w/ home O2 at 2L/min 24/7...    OSA> sleep study done by DrSmith/ DrTurner & CPAP titration study completed & placed on low dose CPAP~6cmH2O => she hasn't seen DrTurner yet but was started on CPAP as part of her PulmHTN protocol...    Pos Quantiferon Gold> w/ hx remote Tb exposure, NEG CXR- no active dis... EXAM reveals Afeb, VSS, O2sat=97% on O2 at 2L/min pulse dose;  HEENT- neg, mallampati1;  Chest- clear w/o w/r/r;  Heart- rr, gr1/6SEM w/o r/g;  Abd- soft, neg;  Ext- 1+edema, no c/c... We reviewed prob list, meds, xrays and labs>> IMP/PLAN>>  She needs f/u w/ her cardiologist & sleep med; we will plan recheck in 3-4 month w/ repeat CT Chest to f/u on nodule at that time...   ~  March 24, 2015:  45mo ROV & Jordan Barker had her f/u w/ CARDS office PA 10/4> OSA, PulmHTN, modMR, severeTR, CKD; no CP, c/o fatigue & dyspnea, doesn't like the O2; they recommended a conservative approach in this 82 y/o woman- same meds, optimize CPAP, they did not favor RHC or specific pulmHTN meds, they will keep watch on 2DEcho...  She also saw her sleep doc DrTTurner 10/19> ESS=11, PSG showed mild to mod OSA w/ AHI=14.4/hr w/ severe O2sat to 73%, CPAP titration to 5cmH2O w/ full face mask & she reports doing satis- more rested, less sleepy; download revealed AHI=1.1/hr on 5cmH20 pressure & using it >4H/night 83% of the time...  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    6mm RLL pulmonary nodule on  CT Chest>  This is asymptomatic and incidental, we will plan f/u CT later.Marland KitchenMarland Kitchen  Pulmonary HTN w/ PASP~60 by 2DEcho in [place] 2/16> on O2 at 2L/min 24/7; this is most likely multifactorial secondary to valvular heart dis, diastolic CHF, OSA, combined obstructive & restrictive lung dis; followed by DrHSmith & they plan conservative approach & serial Echos......     Hypoxemia> treated w/ home O2 at 2L/min 24/7...    OSA> sleep study done by DrSmith/ DrTurner & CPAP titration study completed & placed on CPAP 5cmH2O => she had f/u 01/2015 & improved...    Pos Quantiferon Gold> w/ hx remote Tb exposure, NEG CXR- no active dis & not producing any sput...    Cards-- HBP, extensive coronary calcif on CTChest, valvular heart dis w/ modMR, severeTR, diastolicCHF & followed by DrHSmith    Renal insuffic> Cr in the 1.3-1.4 range on meds    Pancytopenia>  Prev blood work by PCP not avail for review- Labs 6/16 showed Hg=9.6, MCV=93.7, Plat=130K, Sed=23; Fe=99 (41%sat), Ferritin=170, B12=634, MM panel showed norm Ig's but sl restricted mobility in IgG & Kappa lanes- suggest repeat in 81mo => Labs 12/16 showed Hg=9.8, MCV=93.7, WBC=4.7, Plat=125K; SPE/IEP- similar findings w/ area of slightly restricted mobility in the IgG and Kappa lanes;  They are concerned & we will refer to HEME...  EXAM reveals Afeb, VSS, O2sat=96% on O2 at 2L/min pulse dose;  HEENT- neg, mallampati1;  Chest- clear w/o w/r/r;  Heart- rr, gr1/6SEM w/o r/g;  Abd- soft, neg;  Ext- 1+edema, no c/c.  LABS 03/24/15:  Chems- ok w/ Cr=1.27 & HCO3=30 on Lasix20+Diamox250;  CBC- 9.8, WBC=4.7, Plat=125K;  BNP=696 (prev 866)... IMP/PLAN>>  With Cr stable & BNP=696 we decided to incr the Lasix to 40mg  Qam, continue the Acetazolamide 250mg  Q-afternoon & recehck labs [phone]...  ADDENDUM>>  LABS 04/22/15 showed Chems- ok w/ Cr=1.35, HCO3=30, BNP=559, and 81mo f/u SPE/IEP- similar restricted bands w/o monoclonal gammopathy; referred to HEME for pancytopenia...   ~  June 23, 2015:  19mo ROV & pulmonary follow up visit>  She reports that her breathing has been stable over the interval; she has been using her O2 regularly (although she notes that she leaves it off for brief periods to shower etc & sats remain in the 90's on her home checks); she is due for the 54yr f/u CT Chest to check the 6mm RLL nodule seen 07/2014;  Her CC is some left hip pain & she takes OTC meds prn... We reviewed the above medical issues- she is reminded to f/u w/ DrHSmith & DrTTurner... EXAM reveals Afeb, VSS, O2sat=98% on O2 at 2L/min pulse dose;  HEENT- neg, mallampati1;  Chest- clear w/o w/r/r;  Heart- rr, gr1/6SEM w/o r/g;  Abd- soft, neg;  Ext- 1+edema, no c/c.  CXR 06/23/15>  Mild hyperinflation, atherosclerotic Ao, osteopenia & degen changes in Tspine, 6mm right base nodule w/o change- NAD...   LABS 06/23/15>  Chems- stable w/ BS=138, BUN=20, Cr=1.44, BNP=643;  CBC- stable w/ Hg=9.6, Hct=29.0, Plat=138K, WBC=6.0;  SPE/IEP> similar results (note- IEP wasn't done)...  CT Chest 07/03/15 showed mild cardiomeg, atherosclerotic Ao & enlarge PA w/ 3.8cm outflow tract; bilat pulm nodules are all similar to prev, one new RLL nodule ~73mm size, smallHH, no adenopathy...  IMP/PLAN>>  She is rec to continue O2 24/7 and same meds for now; she needs regular f/u w/ Cards- DrHSmith & DrTTurner; we will refer to Heme per request for her anemia & borderline MGUS; we plan recheck in 81mo, sooner if needed...    ~  December 29, 2015:  81mo ROV  Past Medical History  Diagnosis Date  . On home oxygen therapy >> on Home O2 at 2L/min continuous... 08/13/2014    Commencing 05/2014   . Moderate to severe pulmonary hypertension >> on Oxygen, CPAP Qhs, Lasix/Diamox 08/13/2014    Echo 05/2014 PASP 61 mmHg   . Moderate mitral regurgitation 08/13/2014    Echo 2016   . Essential hypertension >> on Metop50-1/2Bid, Lisin40, Cardura2, Lasix20/ Diamox250 08/13/2014  . CHF (congestive heart failure)         --bilat LE  edema & fatigue    GERD >. on Prilosec20     Vit D Deficiency >> on VitD 50K weekly     No past surgical history on file.  S/P Appendectomy & Hernia repair...   Outpatient Encounter Prescriptions as of 12/29/2015  Medication Sig  . alendronate (FOSAMAX) 70 MG tablet Take 70 mg by mouth once a week. Take with a full glass of water on an empty stomach.  Takes on Saturdays.  Marland Kitchen aspirin 81 MG tablet Take 81 mg by mouth daily as needed (pain).   Marland Kitchen beta carotene w/minerals (OCUVITE) tablet Take 1 tablet by mouth daily.  . calcium-vitamin D (OSCAL WITH D) 250-125 MG-UNIT tablet Take 1 tablet by mouth 2 (two) times daily. Takes twice daily with meals.  . doxazosin (CARDURA) 2 MG tablet Take 1 mg by mouth 2 (two) times daily.   . furosemide (LASIX) 20 MG tablet TAKE 2 TABLETS(40 MG) BY MOUTH DAILY (Patient taking differently: TAKE 1 TABLET(40 MG) BY MOUTH DAILY)  . lisinopril (PRINIVIL,ZESTRIL) 40 MG tablet Take 40 mg by mouth daily.  . metoprolol succinate (TOPROL-XL) 100 MG 24 hr tablet Take 0.5 tablets by mouth 2 (two) times daily.  Marland Kitchen omeprazole (PRILOSEC) 20 MG capsule Take 20 mg by mouth daily.  . OXYGEN as directed. 2L/MIN BY CANNULA  . Vitamin Mixture (VITAMIN E COMPLETE) CAPS Take 1 capsule by mouth daily as needed (DIZZINESS).  . [DISCONTINUED] acetaZOLAMIDE (DIAMOX) 250 MG tablet TAKE 1 TABLET(250 MG) BY MOUTH DAILY (Patient not taking: Reported on 12/24/2015)   No facility-administered encounter medications on file as of 12/29/2015.     Allergies  Allergen Reactions  . Amlodipine Shortness Of Breath and Other (See Comments)    REACTION: "hot/cold flashes"  . Celecoxib Anxiety and Rash  . Chlorthalidone Anxiety and Rash  . Hydrochlorothiazide Anxiety and Rash  . Sulfa Antibiotics Other (See Comments)    REACTION: "UNKNOWN...BEEN SO LONG AGO"    Current Medications, Allergies, Past Medical History, Past Surgical History, Family History, and Social History were reviewed in [employer] record.   Review of Systems            All symptoms NEG except where BOLDED >>  Constitutional:  F/C/S, fatigue, anorexia, unexpected weight change. HEENT:  HA, visual changes, hearing loss, earache, nasal symptoms, sore throat, mouth sores, hoarseness. Resp:  cough, sputum, hemoptysis; SOB, tightness, wheezing. Cardio:  CP, palpit, DOE, orthopnea, edema. GI:  N/V/D/C, blood in stool; reflux, abd pain, distention, gas. GU:  dysuria, freq, urgency, hematuria, flank pain, voiding difficulty. MS:  joint pain, swelling, tenderness, decr ROM; neck pain, back pain, etc. Neuro:  HA, tremors, seizures, dizziness, syncope, weakness, numbness, gait abn. Skin:  suspicious lesions or skin rash. Heme:  adenopathy, bruising, bleeding. Psyche:  confusion, agitation, sleep disturbance, hallucinations, anxiety, depression suicidal.   Objective:   Physical Exam      Vital Signs:  Reviewed...   General:  WD, WN, 82  y/o WF in NAD, chr ill appearing; alert & oriented; pleasant & cooperative... HEENT:  Obert/AT; Conjunctiva- pink, Sclera- nonicteric, EOM-wnl, PERRLA, EACs-clear, TMs-wnl; NOSE-clear; THROAT-clear & wnl.  Neck:  Supple w/ fair ROM; no JVD; normal carotid impulses w/o bruits; no thyromegaly or nodules palpated; no lymphadenopathy.  Chest:  Clear to P & A; without wheezes, rales, or rhonchi heard. Heart:  Regular Rhythm; without murmurs, rubs, or gallops detected. Abdomen:  Soft & nontender- no guarding or rebound; normal bowel sounds; no organomegaly or masses palpated. Ext:  decrROM; +arthritic changes; no varicose veins, +venous insuffic, tr edema;  Pulses intact w/o bruits. Neuro:  No focal neuro deficits; gait normal & balance OK. Derm:  No lesions noted; no rash etc. Lymph:  No cervical, supraclavicular, axillary, or inguinal adenopathy palpated.   Assessment:      IMP >>  [age] y/o woman w/     Combined obstructive & restrictive lung dis> see spirometry,  remember she is 82 y/o, we will consider f/u w/ FullPFTs later; she refused Anoro trial...    6mm RLL pulmonary nodule> we will plan f/u CT in 6 months    Pulmonary HTN w/ PASP~60 by 2DEcho in [place] 2/16> likely secondary to valvular heart dis, diastolic CHF, OSA, combined obstructive & restrictive lung dis...     Hypoxemia> treated w/ home O2 at 2L/min 24/7...    Pos Quantiferon Gold w/ hx remote Tb exposure, NEG CXR- no active dis...    Anemia-- Hg=9.6 (?ACD) w/ norm Fe/ B12/ and neg SPE/IEP so far...  Sleep Apnea evaluated & treated by DrTTurner:     OSA> sleep study done by DrSmith/ DrTurner & CPAP titration study done & optimum CPAP=5cmH20;  Stable on CPAP per DrTurner 01/2015 w/ compliant download...  Cardiac eval and treatment per DrHSmith:     HBP> controlled on meds- ASA81, Metop25Bid, Lisin40, Cardura2Bid, Lasix20, Diamox250    Extensive coronary calcifications>    Valvular heart disease w/ modMR & TR>        Diastolic CHF>    Pulmonary HTN w/ PASP~60 by 2DEcho in [place] 2/16> likely secondary to valvular heart dis, diastolic CHF, OSA, combined obstructive & restrictive lung dis...   PLAN >>  6/29>  Jordan Barker symptoms would seem to place her in [facility] 3 & the likely etiology of her pulmHTN appears multifactorial w/ cardiac dis, OSA, poss combined pulmonary dis all playing a roll;  Currently she is on O2 at 2L/min & needs to continue this;  Cardiac problems- HBP, diastolicCHF w/ edema, valvular heart dis, and extensive coronary calcif is being managed and followed by DrSmith;  OSA is being managed and followed by DrTurner;  I am reluctant to push the diuretics any further w/ Cr=1.32 but BNP remains elev;  We will add ANORO one puff daily as a trial;  She needs ROV w/ Cards- DrHSmith & has yet to see DrTurner for her OSA- CPAP titration test pending;  Ultimately when all treatments in place & optimized they will want to recheck 2DEcho to see if PASP has improved, otherw might be a candidate for  RHC & PH therapy despite her age. 7/26>  She will continue the O2 at 2L/min 24/7;  I am reluctant to push the diuretics any further w/ Cr=1.32 but BNP remains elev;  We will add ANORO one puff daily as a trial;  She needs ROV w/ Cards- DrHSmith & has yet to see DrTurner for her OSA- CPAP titration test pending;  Ultimately when all  treatments in place & optimized they will want to recheck 2DEcho to see if PASP has improved, otherw might be a candidate for RHC & PH therapy despite her age. 9/9>  She needs f/u w/ her cardiologist & sleep med; we will plan recheck in 3-4 month w/ repeat CT Chest to f/u on nodule at that time 12/5>  With Cr stable & BNP=696 we decided to incr the Lasix to 40mg  Qam, continue the Acetazolamide 250mg  Q-afternoon & recehck labs [phone].     Plan:     Patient's Medications  New Prescriptions   No medications on file  Previous Medications   ALENDRONATE (FOSAMAX) 70 MG TABLET    Take 70 mg by mouth once a week. Take with a full glass of water on an empty stomach.  Takes on Saturdays.   ASPIRIN 81 MG TABLET    Take 81 mg by mouth daily as needed (pain).    BETA CAROTENE W/MINERALS (OCUVITE) TABLET    Take 1 tablet by mouth daily.   CALCIUM-VITAMIN D (OSCAL WITH D) 250-125 MG-UNIT TABLET    Take 1 tablet by mouth 2 (two) times daily. Takes twice daily with meals.   DOXAZOSIN (CARDURA) 2 MG TABLET    Take 1 mg by mouth 2 (two) times daily.    FUROSEMIDE (LASIX) 20 MG TABLET    TAKE 2 TABLETS(40 MG) BY MOUTH DAILY   LISINOPRIL (PRINIVIL,ZESTRIL) 40 MG TABLET    Take 40 mg by mouth daily.   METOPROLOL SUCCINATE (TOPROL-XL) 100 MG 24 HR TABLET    Take 0.5 tablets by mouth 2 (two) times daily.   OMEPRAZOLE (PRILOSEC) 20 MG CAPSULE    Take 20 mg by mouth daily.   OXYGEN    as directed. 2L/MIN BY CANNULA   VITAMIN MIXTURE (VITAMIN E COMPLETE) CAPS    Take 1 capsule by mouth daily as needed (DIZZINESS).  Modified Medications   No medications on file  Discontinued Medications    ACETAZOLAMIDE (DIAMOX) 250 MG TABLET    TAKE 1 TABLET(250 MG) BY MOUTH DAILY

## 2015-12-29 NOTE — Patient Instructions (Signed)
Today we updated your med list in our EPIC system...    Continue your current medications the same...  Remember to eliminate salt/ sodium from your diet...  Keep legs elevated & consider wearing support hose...  Continue your OXYGEN 24/7...  Call for any questions...  Let's plan a follow up visit in 54mo, sooner if needed for problems.Marland Kitchen..Marland Kitchen

## 2016-01-02 ENCOUNTER — Other Ambulatory Visit: Payer: Self-pay | Admitting: Radiology

## 2016-01-06 ENCOUNTER — Ambulatory Visit: Payer: Medicare Other | Admitting: Pulmonary Disease

## 2016-01-06 ENCOUNTER — Ambulatory Visit (HOSPITAL_COMMUNITY)
Admission: RE | Admit: 2016-01-06 | Discharge: 2016-01-06 | Disposition: A | Discharge status: Home / Self Care | Payer: Medicare Other | Source: Ambulatory Visit | Attending: Interventional Radiology | Admitting: Interventional Radiology

## 2016-01-06 ENCOUNTER — Encounter (HOSPITAL_COMMUNITY): Payer: Self-pay

## 2016-01-06 DIAGNOSIS — M4856XA Collapsed vertebra, not elsewhere classified, lumbar region, initial encounter for fracture: Secondary | ICD-10-CM | POA: Diagnosis present

## 2016-01-06 DIAGNOSIS — G4733 Obstructive sleep apnea (adult) (pediatric): Secondary | ICD-10-CM | POA: Diagnosis not present

## 2016-01-06 DIAGNOSIS — I081 Rheumatic disorders of both mitral and tricuspid valves: Secondary | ICD-10-CM | POA: Insufficient documentation

## 2016-01-06 DIAGNOSIS — I272 Other secondary pulmonary hypertension: Secondary | ICD-10-CM | POA: Diagnosis not present

## 2016-01-06 DIAGNOSIS — I130 Hypertensive heart and chronic kidney disease with heart failure and stage 1 through stage 4 chronic kidney disease, or unspecified chronic kidney disease: Secondary | ICD-10-CM | POA: Diagnosis not present

## 2016-01-06 DIAGNOSIS — J849 Interstitial pulmonary disease, unspecified: Secondary | ICD-10-CM | POA: Diagnosis not present

## 2016-01-06 DIAGNOSIS — Z9981 Dependence on supplemental oxygen: Secondary | ICD-10-CM | POA: Diagnosis not present

## 2016-01-06 DIAGNOSIS — Z7982 Long term (current) use of aspirin: Secondary | ICD-10-CM | POA: Diagnosis not present

## 2016-01-06 DIAGNOSIS — N183 Chronic kidney disease, stage 3 (moderate): Secondary | ICD-10-CM | POA: Diagnosis not present

## 2016-01-06 DIAGNOSIS — I5032 Chronic diastolic (congestive) heart failure: Secondary | ICD-10-CM | POA: Insufficient documentation

## 2016-01-06 HISTORY — PX: IR GENERIC HISTORICAL: IMG1180011

## 2016-01-06 LAB — PROTIME-INR
INR: 1.08
PROTHROMBIN TIME: 14.1 s (ref 11.4–15.2)

## 2016-01-06 LAB — CBC WITH DIFFERENTIAL/PLATELET
BASOS ABS: 0 10*3/uL (ref 0.0–0.1)
Basophils Relative: 0 %
EOS PCT: 4 %
Eosinophils Absolute: 0.2 10*3/uL (ref 0.0–0.7)
HCT: 30.1 % — ABNORMAL LOW (ref 36.0–46.0)
HEMOGLOBIN: 9.9 g/dL — AB (ref 12.0–15.0)
LYMPHS ABS: 1.4 10*3/uL (ref 0.7–4.0)
LYMPHS PCT: 24 %
MCH: 31 pg (ref 26.0–34.0)
MCHC: 32.9 g/dL (ref 30.0–36.0)
MCV: 94.4 fL (ref 78.0–100.0)
Monocytes Absolute: 0.7 10*3/uL (ref 0.1–1.0)
Monocytes Relative: 12 %
NEUTROS PCT: 61 %
Neutro Abs: 3.5 10*3/uL (ref 1.7–7.7)
PLATELETS: 149 10*3/uL — AB (ref 150–400)
RBC: 3.19 MIL/uL — AB (ref 3.87–5.11)
RDW: 14.3 % (ref 11.5–15.5)
WBC: 5.8 10*3/uL (ref 4.0–10.5)

## 2016-01-06 LAB — BASIC METABOLIC PANEL
ANION GAP: 8 (ref 5–15)
BUN: 14 mg/dL (ref 6–20)
CHLORIDE: 95 mmol/L — AB (ref 101–111)
CO2: 32 mmol/L (ref 22–32)
Calcium: 9.2 mg/dL (ref 8.9–10.3)
Creatinine, Ser: 1.14 mg/dL — ABNORMAL HIGH (ref 0.44–1.00)
GFR calc Af Amer: 46 mL/min — ABNORMAL LOW (ref >60)
GFR, EST NON AFRICAN AMERICAN: 40 mL/min — AB (ref >60)
Glucose, Bld: 109 mg/dL — ABNORMAL HIGH (ref 65–99)
POTASSIUM: 4.4 mmol/L (ref 3.5–5.1)
Sodium: 135 mmol/L (ref 135–145)

## 2016-01-06 MED ORDER — MIDAZOLAM HCL 2 MG/2ML IJ SOLN
INTRAMUSCULAR | Status: AC | PRN
  Administered 2016-01-06 (×2): 0.5 mg via INTRAVENOUS

## 2016-01-06 MED ORDER — CEFAZOLIN SODIUM-DEXTROSE 2-4 GM/100ML-% IV SOLN
2.0000 g | INTRAVENOUS | Status: AC
  Administered 2016-01-06: 2 g via INTRAVENOUS
  Filled 2016-01-06: qty 100

## 2016-01-06 MED ORDER — LIDOCAINE HCL (PF) 1 % IJ SOLN
INTRAMUSCULAR | Status: AC
  Filled 2016-01-06: qty 30

## 2016-01-06 MED ORDER — SODIUM CHLORIDE 0.9 % IV SOLN
INTRAVENOUS | Status: DC
  Administered 2016-01-06: 12:00:00 via INTRAVENOUS

## 2016-01-06 MED ORDER — MIDAZOLAM HCL 2 MG/2ML IJ SOLN
INTRAMUSCULAR | Status: AC
  Filled 2016-01-06: qty 4

## 2016-01-06 MED ORDER — FENTANYL CITRATE (PF) 100 MCG/2ML IJ SOLN
INTRAMUSCULAR | Status: AC | PRN
  Administered 2016-01-06: 25 ug via INTRAVENOUS
  Administered 2016-01-06: 12.5 ug via INTRAVENOUS

## 2016-01-06 MED ORDER — FLUMAZENIL 0.5 MG/5ML IV SOLN
INTRAVENOUS | Status: AC
  Filled 2016-01-06: qty 5

## 2016-01-06 MED ORDER — NALOXONE HCL 0.4 MG/ML IJ SOLN
INTRAMUSCULAR | Status: AC
  Filled 2016-01-06: qty 1

## 2016-01-06 MED ORDER — LIDOCAINE HCL (PF) 1 % IJ SOLN
INTRAMUSCULAR | Status: AC | PRN
  Administered 2016-01-06: 20 mL

## 2016-01-06 MED ORDER — FENTANYL CITRATE (PF) 100 MCG/2ML IJ SOLN
INTRAMUSCULAR | Status: AC
  Filled 2016-01-06: qty 2

## 2016-01-06 NOTE — Discharge Instructions (Signed)
Moderate Conscious Sedation, Adult, Care After Refer to this sheet in the next few weeks. These instructions provide you with information on caring for yourself after your procedure. Your health care provider may also give you more specific instructions. Your treatment has been planned according to current medical practices, but problems sometimes occur. Call your health care provider if you have any problems or questions after your procedure. WHAT TO EXPECT AFTER THE PROCEDURE  After your procedure:  You may feel sleepy, clumsy, and have poor balance for several hours.  Vomiting may occur if you eat too soon after the procedure. HOME CARE INSTRUCTIONS  Do not participate in any activities where you could become injured for at least 24 hours. Do not:  Drive.  Swim.  Ride a bicycle.  Operate heavy machinery.  Cook.  Use power tools.  Climb ladders.  Work from a high place.  Do not make important decisions or sign legal documents until you are improved.  If you vomit, drink water, juice, or soup when you can drink without vomiting. Make sure you have little or no nausea before eating solid foods.  Only take over-the-counter or prescription medicines for pain, discomfort, or fever as directed by your health care provider.  Make sure you and your family fully understand everything about the medicines given to you, including what side effects may occur.  You should not drink alcohol, take sleeping pills, or take medicines that cause drowsiness for at least 24 hours.  If you smoke, do not smoke without supervision.  If you are feeling better, you may resume normal activities 24 hours after you were sedated.  Keep all appointments with your health care provider. SEEK MEDICAL CARE IF:  Your skin is pale or bluish in color.  You continue to feel nauseous or vomit.  Your pain is getting worse and is not helped by medicine.  You have bleeding or swelling.  You are still  sleepy or feeling clumsy after 24 hours. SEEK IMMEDIATE MEDICAL CARE IF:  You develop a rash.  You have difficulty breathing.  You develop any type of allergic problem.  You have a fever. MAKE SURE YOU:  Understand these instructions.  Will watch your condition.  Will get help right away if you are not doing well or get worse.   This information is not intended to replace advice given to you by your health care provider. Make sure you discuss any questions you have with your health care provider.   Document Released: 01/24/2013 Document Revised: 04/26/2014 Document Reviewed: 01/24/2013 Elsevier Interactive Patient Education 2016 [employer] KYPHOPLASTY/VERTEBROPLASTY DISCHARGE INSTRUCTIONS  Medications:     Resume all home medications as before procedure.                  Continue your pain medications as prescribed as needed.  Over the next 3-5 days, decrease your pain medication as tolerated.  Over the counter medications (i.e. Tylenol, ibuprofen, and aleve) may be substituted once severe/moderate pain symptoms have subsided.   Wound Care: - Bandages may be removed the day following your procedure.  You may get your incision wet once bandages are removed.  Bandaids may be used to cover the incisions until scab formation.  Topical ointments are optional.  - If you develop a fever greater than 101 degrees, have increased skin redness at the incision sites or pus-like oozing from incisions occurring within 1 week of the procedure, contact radiology at [phone] or [phone].  - Ice pack to  back for 15-20 minutes 2-3 time per day for first 2-3 days post procedure.  The ice will expedite muscle healing and help with the pain from the incisions.   Activity: - Bedrest today with limited activity for 24 hours post procedure.  - No driving for 48 hours.  - Increase your activity as tolerated after bedrest (with assistance if necessary).  - Refrain from any strenuous activity or  heavy lifting (greater than 10 lbs.).   Follow up: - Contact radiology at [phone] or [phone] if any questions/concerns.  - A physician assistant from radiology will contact you in approximately 1 week.  - If a biopsy was performed at the time of your procedure, your referring physician should receive the results in usually 2-3 days.

## 2016-01-06 NOTE — Procedures (Signed)
Lumbar L3 kyphoplasty No complication No blood loss. See complete dictation in [facility] P[facility].  

## 2016-01-06 NOTE — Progress Notes (Signed)
Referring Physician(s): Prueter,K  Supervising Physician: Oley Balm  Patient Status:  Outpatient  Chief Complaint:  Back pain, L3 compression fracture  Subjective: Patient familiar to IR service from recent consultation with Dr. Deanne Coffer 12/24/15 regarding treatment options for symptomatic L3 compression fracture. She was deemed an appropriate candidate for L3 vertebroplasty/kyphoplasty and presents today for the procedure. See consult note for additional details. She currently denies fever, chest pain, significant abdominal pain, vomiting or abnormal bleeding. She does have occasional headaches, some dyspnea with exertion, occasional cough, low back pain with radiation down right lower extremity and occasional right lower extremity paresthesias as well as occasional nausea. Past Medical History:  Diagnosis Date  . Chronic diastolic CHF (congestive heart failure) (HCC)   . Chronic respiratory failure (HCC)   . CKD (chronic kidney disease), stage III   . Essential hypertension   . Interstitial lung disease (HCC)   . Mitral regurgitation    a. Echo 05/2014 - mild-mod MR.  . Moderate to severe pulmonary hypertension (HCC)    Echo 05/2014 PASP 61 mmHg   . On home oxygen therapy    Commencing 05/2014   . OSA (obstructive sleep apnea)   . Pulmonary nodule    a. Followed by pulm.  . Tricuspid regurgitation    a. Echo 05/2014 - mod-sev TR.  History reviewed. No pertinent surgical history.   Allergies: Amlodipine; Celecoxib; Chlorthalidone; Hydrochlorothiazide; and Sulfa antibiotics  Medications: Prior to Admission medications   Medication Sig Start Date End Date Taking? Authorizing Provider  alendronate (FOSAMAX) 70 MG tablet Take 70 mg by mouth once a week. Take with a full glass of water on an empty stomach.  Takes on Saturdays.    Historical Provider, MD  aspirin 81 MG tablet Take 81 mg by mouth daily as needed (pain).     Historical Provider, MD  beta carotene w/minerals  (OCUVITE) tablet Take 1 tablet by mouth daily. 03/23/13   Historical Provider, MD  calcium-vitamin D (OSCAL WITH D) 250-125 MG-UNIT tablet Take 1 tablet by mouth 2 (two) times daily. Takes twice daily with meals.    Historical Provider, MD  doxazosin (CARDURA) 2 MG tablet Take 1 mg by mouth 2 (two) times daily.     Historical Provider, MD  furosemide (LASIX) 20 MG tablet TAKE 2 TABLETS(40 MG) BY MOUTH DAILY Patient taking differently: TAKE 1 TABLET(40 MG) BY MOUTH DAILY 10/09/15   Michele Mcalpine, MD  lisinopril (PRINIVIL,ZESTRIL) 40 MG tablet Take 40 mg by mouth daily. 06/20/14   Historical Provider, MD  metoprolol succinate (TOPROL-XL) 100 MG 24 hr tablet Take 0.5 tablets by mouth 2 (two) times daily. 12/30/14   Historical Provider, MD  omeprazole (PRILOSEC) 20 MG capsule Take 20 mg by mouth daily. 02/26/14   Historical Provider, MD  OXYGEN as directed. 2L/MIN BY CANNULA    Historical Provider, MD  Vitamin Mixture (VITAMIN E COMPLETE) CAPS Take 1 capsule by mouth daily as needed (DIZZINESS).    Historical Provider, MD     Vital Signs: BP 130/71 (BP Location: Right Arm)   Pulse 68   Temp 97.8 F (36.6 C) (Oral)   Resp 16   SpO2 97%   Physical Exam patient awake, alert. Chest clear to auscultation bilaterally. Heart with regular rate and rhythm, positive murmur. Abdomen soft, positive bowel sounds, nontender. L3 paravertebral tenderness ;lower extremities with trace edema  Imaging: No results found.  Labs:  CBC:  Recent Labs  03/24/15 1216 06/23/15 1106 06/25/15 1019  WBC  4.7 6.0 6.4  HGB 9.8* 9.6* 9.2*  HCT 30.2* 29.0* 28.4*  PLT 125.0* 138.0* 126*    COAGS: No results for input(s): INR, APTT in the last 8760 hours.  BMP:  Recent Labs  03/24/15 1216 04/22/15 0839 06/23/15 1106 06/25/15 1020  NA 139 140 138 140  K 4.4 4.1 4.0 3.8  CL 104 105 101  --   CO2 30 30 31 28   GLUCOSE 100* 122* 138* 109  BUN 19 19 20  24.4  CALCIUM 9.3 9.2 9.3 9.0  CREATININE 1.27* 1.35*  1.44* 1.4*    LIVER FUNCTION TESTS:  Recent Labs  06/25/15 1020  BILITOT 0.51  AST 19  ALT <9  ALKPHOS 66  PROT 6.8  ALBUMIN 3.7    Assessment and Plan: Patient with history of osteoporotic symptomatic L3 compression fracture; status post recent consultation with Dr. Deanne CofferHassell 12/24/15 to discuss treatment options and deemed appropriate candidate for L3 vertebroplasty/kyphoplasty. She presents today for the procedure. Risks and benefits discussed with the patient/son including, but not limited to education regarding the natural healing process of compression fractures without intervention, bleeding, infection, cement migration which may cause spinal cord damage, paralysis, pulmonary embolism or even death.All of the patient's questions were answered, patient is agreeable to proceed.Consent signed and in chart.Labs pending.      Electronically Signed: D. Jeananne RamaKevin Mekel Haverstock 01/06/2016, 12:22 PM   I spent a total of 25 minutes at the the patient's bedside AND on the patient's hospital floor or unit, greater than 50% of which was counseling/coordinating care for L3 vertebroplasty/kyphoplasty

## 2016-01-13 ENCOUNTER — Other Ambulatory Visit (HOSPITAL_COMMUNITY): Payer: Self-pay | Admitting: Interventional Radiology

## 2016-01-13 DIAGNOSIS — S32000A Wedge compression fracture of unspecified lumbar vertebra, initial encounter for closed fracture: Secondary | ICD-10-CM

## 2016-01-22 ENCOUNTER — Telehealth: Payer: Self-pay | Admitting: Interventional Radiology

## 2016-01-22 ENCOUNTER — Emergency Department (HOSPITAL_COMMUNITY)
Admission: EM | Admit: 2016-01-22 | Discharge: 2016-01-22 | Disposition: A | Discharge status: Home / Self Care | Payer: Medicare Other | Attending: Emergency Medicine | Admitting: Emergency Medicine

## 2016-01-22 ENCOUNTER — Encounter (HOSPITAL_COMMUNITY): Payer: Self-pay

## 2016-01-22 ENCOUNTER — Emergency Department (HOSPITAL_COMMUNITY): Payer: Medicare Other

## 2016-01-22 DIAGNOSIS — M545 Low back pain, unspecified: Secondary | ICD-10-CM

## 2016-01-22 DIAGNOSIS — Z87891 Personal history of nicotine dependence: Secondary | ICD-10-CM | POA: Insufficient documentation

## 2016-01-22 DIAGNOSIS — I130 Hypertensive heart and chronic kidney disease with heart failure and stage 1 through stage 4 chronic kidney disease, or unspecified chronic kidney disease: Secondary | ICD-10-CM | POA: Diagnosis not present

## 2016-01-22 DIAGNOSIS — I5032 Chronic diastolic (congestive) heart failure: Secondary | ICD-10-CM | POA: Diagnosis not present

## 2016-01-22 DIAGNOSIS — Z7982 Long term (current) use of aspirin: Secondary | ICD-10-CM | POA: Diagnosis not present

## 2016-01-22 DIAGNOSIS — Z79899 Other long term (current) drug therapy: Secondary | ICD-10-CM | POA: Insufficient documentation

## 2016-01-22 DIAGNOSIS — N183 Chronic kidney disease, stage 3 (moderate): Secondary | ICD-10-CM | POA: Insufficient documentation

## 2016-01-22 MED ORDER — TRAMADOL HCL 50 MG PO TABS
50.0000 mg | ORAL_TABLET | Freq: Once | ORAL | Status: AC
  Administered 2016-01-22: 50 mg via ORAL
  Filled 2016-01-22: qty 1

## 2016-01-22 MED ORDER — TRAMADOL HCL 50 MG PO TABS: 50.0000 mg | ORAL_TABLET | Freq: Four times a day (QID) | ORAL | 0 refills | Status: AC | PRN

## 2016-01-22 NOTE — ED Provider Notes (Signed)
[facility]-EMERGENCY DEPT Provider Note   CSN: [id number] Arrival date & time: 01/22/16  1126     History   Chief Complaint Chief Complaint  Patient presents with  . Back Pain    HPI Jordan Barker is a 82 y.o. female with a past medical history significant for previous L3 compression fracture, hypertension, valvular heart disease, CHF, CKD, atherosclerosis, and chronic respiratory failure presenting with acute onset thoracic and lumbar back pain. Patient states that back pain presented acutely 2 days ago when she was sitting up. She states that pain was sudden but did not seem to have any specific cause. She denies any trauma/injury or twisting motion. Pain is localized to the midline spine at the thoracic and lumbar region. She endorses some moderate radiation of pain to her lateral right leg. She denies any weakness or numbness in her extremities. She denies any saddle anesthesia, or bowel/bladder incontinence.   Patient recently underwent a fluoroscopic guided kyphoplasty of the L3 vertebral body on 01/06/16 by interventional radiology. Patient states that pain is similar to that experienced with her initial compression fracture however it is now significantly worse.   HPI  Past Medical History:  Diagnosis Date  . Chronic diastolic CHF (congestive heart failure) (HCC)   . Chronic respiratory failure (HCC)   . CKD (chronic kidney disease), stage III   . Essential hypertension   . Interstitial lung disease (HCC)   . Mitral regurgitation    a. Echo 05/2014 - mild-mod MR.  . Moderate to severe pulmonary hypertension    Echo 05/2014 PASP 61 mmHg   . On home oxygen therapy    Commencing 05/2014   . OSA (obstructive sleep apnea)   . Pulmonary nodule    a. Followed by pulm.  . Tricuspid regurgitation    a. Echo 05/2014 - mod-sev TR.    Patient Active Problem List   Diagnosis Date Noted  . CKD (chronic kidney disease), stage III   . Interstitial lung disease (HCC)   . Chronic respiratory  failure (HCC)   . Tricuspid regurgitation   . Mitral regurgitation   . Chronic diastolic CHF (congestive heart failure) (HCC)   . Valvular heart disease 10/16/2014  . Nodule of right lung 10/04/2014  . Sleep apnea, obstructive 10/04/2014  . Moderate to severe pulmonary hypertension 08/13/2014  . On home oxygen therapy 08/13/2014  . Essential hypertension 08/13/2014    Past Surgical History:  Procedure Laterality Date  . IR GENERIC HISTORICAL  01/06/2016   IR KYPHO LUMBAR INC FX REDUCE BONE BX UNI/BIL CANNULATION INC/IMAGING 01/06/2016 Oley Balm, MD [facility]-INTERV RAD    OB History    No data available       Home Medications    Prior to Admission medications   Medication Sig Start Date End Date Taking? Authorizing Provider  alendronate (FOSAMAX) 70 MG tablet Take 70 mg by mouth once a week. Take with a full glass of water on an empty stomach.  Takes on Saturdays.    Historical Provider, MD  aspirin 81 MG tablet Take 81 mg by mouth daily as needed (pain).     Historical Provider, MD  beta carotene w/minerals (OCUVITE) tablet Take 1 tablet by mouth daily. 03/23/13   Historical Provider, MD  calcium-vitamin D (OSCAL WITH D) 250-125 MG-UNIT tablet Take 1 tablet by mouth 2 (two) times daily. Takes twice daily with meals.    Historical Provider, MD  doxazosin (CARDURA) 2 MG tablet Take 1 mg by mouth 2 (two) times daily.  Historical Provider, MD  furosemide (LASIX) 20 MG tablet TAKE 2 TABLETS(40 MG) BY MOUTH DAILY Patient taking differently: TAKE 1 TABLET(40 MG) BY MOUTH DAILY 10/09/15   Michele Mcalpine, MD  lisinopril (PRINIVIL,ZESTRIL) 40 MG tablet Take 40 mg by mouth daily. 06/20/14   Historical Provider, MD  metoprolol succinate (TOPROL-XL) 100 MG 24 hr tablet Take 0.5 tablets by mouth 2 (two) times daily. 12/30/14   Historical Provider, MD  omeprazole (PRILOSEC) 20 MG capsule Take 20 mg by mouth daily. 02/26/14   Historical Provider, MD  OXYGEN as directed. 2L/MIN BY CANNULA     Historical Provider, MD  traMADol (ULTRAM) 50 MG tablet Take 1 tablet (50 mg total) by mouth every 6 (six) hours as needed. 01/22/16   Kathee Delton, MD  Vitamin Mixture (VITAMIN E COMPLETE) CAPS Take 1 capsule by mouth daily as needed (DIZZINESS).    Historical Provider, MD    Family History Family History  Problem Relation Age of Onset  . Hypertension Mother   . Ovarian cancer Mother   . Hypertension Father   . Parkinson's disease Father   . Heart disease Brother   . Cancer Brother   . Parkinson's disease Brother   . Prostate cancer Brother   . Heart disease Brother   . Hypertension Brother   . Congestive Heart Failure Brother   . Bone cancer Brother     Social History Social History  Substance Use Topics  . Smoking status: Former Smoker    Packs/day: 0.50    Years: 2.00    Types: Cigarettes    Quit date: 10/16/1946  . Smokeless tobacco: Never Used  . Alcohol use Not on file     Allergies   Amlodipine; Celecoxib; Chlorthalidone; Hydrochlorothiazide; and Sulfa antibiotics   Review of Systems Review of Systems  Constitutional: Negative for chills, diaphoresis, fatigue and fever.  Eyes: Negative for photophobia and visual disturbance.  Respiratory: Negative for cough, choking, chest tightness, shortness of breath and wheezing.   Cardiovascular: Negative for chest pain, palpitations and leg swelling.  Gastrointestinal: Negative for diarrhea, nausea and vomiting.  Genitourinary: Negative for dysuria and flank pain.  Musculoskeletal: Positive for back pain. Negative for neck pain and neck stiffness.  Skin: Negative for rash.  Neurological: Negative for dizziness, facial asymmetry, weakness, light-headedness, numbness and headaches.     Physical Exam Updated Vital Signs BP 147/69 (BP Location: Right Arm)   Pulse 76   Temp 97.4 F (36.3 C) (Oral)   Resp 18   Ht 5\' 4"  (1.626 m)   Wt 64.4 kg   SpO2 99%   BMI 24.37 kg/m   Physical Exam  Constitutional: She is  oriented to person, place, and time. She appears well-developed and well-nourished. No distress.  HENT:  Head: Normocephalic and atraumatic.  Nose: Nose normal.  Mouth/Throat: Oropharynx is clear and moist.  Eyes: Conjunctivae and EOM are normal. Pupils are equal, round, and reactive to light.  Neck: Normal range of motion.  Cardiovascular: Normal rate, regular rhythm, normal heart sounds and intact distal pulses.   Pulmonary/Chest: Effort normal and breath sounds normal. No respiratory distress.  Abdominal: Soft. She exhibits no distension. There is no tenderness. There is no guarding.  Musculoskeletal:       Thoracic back: She exhibits tenderness, bony tenderness and pain. She exhibits no deformity and no laceration.       Back:       Arms: Neurological: She is alert and oriented to person, place, and time.  She displays normal reflexes. No cranial nerve deficit. She exhibits normal muscle tone.  Skin: Skin is warm and dry. Capillary refill takes less than 2 seconds. No rash noted. She is not diaphoretic.  Psychiatric: She has a normal mood and affect. Her behavior is normal.     ED Treatments / Results  Labs (all labs ordered are listed, but only abnormal results are displayed) Labs Reviewed - No data to display  EKG  EKG Interpretation None       Radiology Dg Thoracic Spine 2 View  Result Date: 01/22/2016 CLINICAL DATA:  82 year old female with sharp mid thoraco lumbar spine pain. History of prior lumbar compression fractures. EXAM: THORACIC SPINE 2 VIEWS COMPARISON:  Concurrently obtained radiographs of the lumbar spine ; most recent CT scan of the chest 07/03/2015 FINDINGS: No evidence of acute thoracic compression fracture. Multilevel degenerative endplate spurring. Incompletely imaged changes of cement augmentation at L3. Extensive calcification throughout the mitral valve annulus consistent with caseous calcification. Calcifications present in the aorta. The visualized  lungs are clear. IMPRESSION: 1. No evidence of acute thoracic compression fracture or malalignment. 2. Caseous calcification of the mitral valve annulus. 3.  Aortic Atherosclerosis (ICD10-170.0). Electronically Signed   By: Malachy Moan M.D.   On: 01/22/2016 13:50   Dg Lumbar Spine 2-3 Views  Result Date: 01/22/2016 CLINICAL DATA:  82 year old female with sharp mid thoraco lumbar spine pain for the past 2 days. Recent L3 kyphoplasty performed 01/06/2016. EXAM: LUMBAR SPINE - 2-3 VIEW COMPARISON:  Lumbar spine radiographs 11/10/2015; CT lumbar spine 12/12/2015 FINDINGS: Changes of prior L3 kyphoplasty. There may be some interval fragmentation of the anterior aspect of the vertebral body. Stable approximately 25% superior endplate compression fracture at L5. No new fracture or malalignment identified. Atherosclerotic calcifications of visible in the abdominal aorta. IMPRESSION: 1. No new fracture or malalignment identified. 2. Stable L3 compression fracture with interval changes of cement augmentation. 3. Stable L5 compression fracture. 4.  Aortic Atherosclerosis (ICD10-170.0) Electronically Signed   By: Malachy Moan M.D.   On: 01/22/2016 13:48    Procedures Procedures (including critical care time)  Medications Ordered in ED Medications  traMADol (ULTRAM) tablet 50 mg (50 mg Oral Given 01/22/16 1445)     Initial Impression / Assessment and Plan / ED Course  I have reviewed the triage vital signs and the nursing notes.  Pertinent labs & imaging results that were available during my care of the patient were reviewed by me and considered in my medical decision making (see chart for details).  Clinical Course   Thoracolumbar pain: Patient is here with signs and symptoms of thoracic and lumbar back pain. Patient recently treated with kyphoplasty of L3 compression fracture. Thoracic and lumbar x-rays yielded no evidence of new compression fracture. Pain likely MSK pain and possible muscle  spasm due to deconditioning and recent healing procedure site. No red flag symptoms on exam. Patient's symptoms improved with one dose of 50 mg tramadol. Patient stated that she was feeling better and was wanting to go home. She was deemed medically stable and discharged home with instruction to follow-up with her PCP. Prescription for tramadol provided.  Final Clinical Impressions(s) / ED Diagnoses   Final diagnoses:  Acute midline low back pain without sciatica    New Prescriptions New Prescriptions   TRAMADOL (ULTRAM) 50 MG TABLET    Take 1 tablet (50 mg total) by mouth every 6 (six) hours as needed.     Kathee Delton, MD 01/22/16 [phone]  Geoffery Lyonsouglas Delo, MD 01/27/16 [phone]

## 2016-01-22 NOTE — ED Notes (Signed)
Patient transported to X-ray 

## 2016-01-22 NOTE — Telephone Encounter (Signed)
The patient's son call the interventional radiology clinic this morning stating the patient has experienced an acute worsening of her low back pain.  As such, I called the patient and the patient's son and elicited the following history:  The patient underwent a technically successful fluoroscopic guided kyphoplasty of the L3 vertebral body on 01/06/2016 by my partner, Dr. Deanne CofferHassell.   While the patient experienced a minimal amount of expected postprocedural discomfort, overall, she was improved following the kyphoplasty until this past Saturday (9/30) at which time she experienced an acute onset of new and worsening low back pain.   The patient is unable to describe whether this pain is at the site of the kyphoplasty or at a different location.  I explained to the patient and the patient's son that I'm concerned she may have experienced a new compression fracture at a different level and as such, requested the patient recommended [facility] department for further evaluation and management.   The patient and the patient's and her agreement with this plan of care.  Katherina RightJay Rayleen Wyrick, MD Pager #: [phone]

## 2016-01-22 NOTE — ED Notes (Signed)
Pt stable, states understanding of discharge instructions 

## 2016-01-22 NOTE — ED Triage Notes (Signed)
Patient complains of lumbar back pain that she describes as severe x 2 days, denies trauma. States that she had back surgery September. Arrived by wheelchair

## 2016-02-04 ENCOUNTER — Ambulatory Visit
Admission: RE | Admit: 2016-02-04 | Discharge: 2016-02-04 | Disposition: A | Discharge status: Home / Self Care | Payer: Medicare Other | Source: Ambulatory Visit | Attending: Interventional Radiology | Admitting: Interventional Radiology

## 2016-02-04 ENCOUNTER — Telehealth: Payer: Self-pay | Admitting: Radiology

## 2016-02-04 DIAGNOSIS — S32000A Wedge compression fracture of unspecified lumbar vertebra, initial encounter for closed fracture: Secondary | ICD-10-CM

## 2016-02-04 HISTORY — PX: IR GENERIC HISTORICAL: IMG1180011

## 2016-02-04 NOTE — Telephone Encounter (Signed)
Left message on son's M# phone re: follow up appointment w/ PCP ([facility] follow up).  Jacqueline Delapena Carmell AustriaGales, RN 02/04/2016 11:54 AM

## 2016-02-04 NOTE — Progress Notes (Signed)
Patient ID: Jordan Barker, female   DOB: [birth year], 82 y.o.   MRN: [id number]       Chief Complaint: Patient was seen in consultation today for  Chief Complaint  Patient presents with  . Follow-up    1 mo follow up Kyphoplasty   at the request of Hubbard Seldon  Referring Physician(s): Prueter,Karen  History of Present Illness: Jordan Barker is a 82 y.o. female who presented to our service in early September after a fall 6 months previously. She had bilateral low back pain with the L3 subacute compression fracture. Serial imaging had shown progressive loss of height at this level. Her pain was inadequately controlled by what pain medicines she can tolerate, and she was restricted in her ability to walk. We proceeded with percutaneous kyphoplasty on 01/06/2016. This was technically successful and she was discharged home in good condition. She has some trouble with neurologic side effects from her narcotic level a medication regimen .  She did have some exacerbation of low back pain on 01/22/2016 and was seen in the ED [facility]. Pain medication changed to tramadol.  She is seen again in the [facility] ED due to abdominal and back pain on 01/28/2016. She was diagnosed with UTI. CT demonstrated stable appearance of the L3 kyphoplasty and stable appearance of old L5 compression deformity. No new fracture. Her pain medication was changed to Tylenol. She feels like her pain is incompletely controlled. She has low back pain below the level of her initial presenting L3 compression fracture pain. The current pain radiates into both hips. No radicular symptoms. No new bowel or bladder control issues.  Past Medical History:  Diagnosis Date  . Chronic diastolic CHF (congestive heart failure) (HCC)   . Chronic respiratory failure (HCC)   . CKD (chronic kidney disease), stage III   . Essential hypertension   . Interstitial lung disease (HCC)   . Mitral regurgitation    a. Echo 05/2014 - mild-mod MR.  . Moderate to  severe pulmonary hypertension    Echo 05/2014 PASP 61 mmHg   . On home oxygen therapy    Commencing 05/2014   . OSA (obstructive sleep apnea)   . Pulmonary nodule    a. Followed by pulm.  . Tricuspid regurgitation    a. Echo 05/2014 - mod-sev TR.    Past Surgical History:  Procedure Laterality Date  . IR GENERIC HISTORICAL  01/06/2016   IR KYPHO LUMBAR INC FX REDUCE BONE BX UNI/BIL CANNULATION INC/IMAGING 01/06/2016 Oley Balm, MD [facility]INTER[facility] RAD    Allergies: Amlodipine; Celecoxib; Chlorthalidone; Hydrochlorothiazide; and Sulfa antibiotics  Medications: Prior to Admission medications   Medication Sig Start Date End Date Taking? Authorizing Provider  alendronate (FOSAMAX) 70 MG tablet Take 70 mg by mouth once a week. Take with a full glass of water on an empty stomach.  Takes on Saturdays.    Historical Provider, MD  aspirin 81 MG tablet Take 81 mg by mouth daily as needed (pain).     Historical Provider, MD  beta carotene w/minerals (OCUVITE) tablet Take 1 tablet by mouth daily. 03/23/13   Historical Provider, MD  calcium-vitamin D (OSCAL WITH D) 250-125 MG-UNIT tablet Take 1 tablet by mouth 2 (two) times daily. Takes twice daily with meals.    Historical Provider, MD  doxazosin (CARDURA) 2 MG tablet Take 1 mg by mouth 2 (two) times daily.     Historical Provider, MD  furosemide (LASIX) 20 MG tablet TAKE 2 TABLETS(40 MG) BY MOUTH DAILY Patient taking differently:  TAKE 1 TABLET(40 MG) BY MOUTH DAILY 10/09/15   Michele Mcalpine, MD  lisinopril (PRINIVIL,ZESTRIL) 40 MG tablet Take 40 mg by mouth daily. 06/20/14   Historical Provider, MD  metoprolol succinate (TOPROL-XL) 100 MG 24 hr tablet Take 0.5 tablets by mouth 2 (two) times daily. 12/30/14   Historical Provider, MD  omeprazole (PRILOSEC) 20 MG capsule Take 20 mg by mouth daily. 02/26/14   Historical Provider, MD  OXYGEN as directed. 2L/MIN BY CANNULA    Historical Provider, MD  traMADol (ULTRAM) 50 MG tablet Take 1 tablet (50 mg total) by  mouth every 6 (six) hours as needed. 01/22/16   Kathee Delton, MD  Vitamin Mixture (VITAMIN E COMPLETE) CAPS Take 1 capsule by mouth daily as needed (DIZZINESS).    Historical Provider, MD     Family History  Problem Relation Age of Onset  . Hypertension Mother   . Ovarian cancer Mother   . Hypertension Father   . Parkinson's disease Father   . Heart disease Brother   . Cancer Brother   . Parkinson's disease Brother   . Prostate cancer Brother   . Heart disease Brother   . Hypertension Brother   . Congestive Heart Failure Brother   . Bone cancer Brother     Social History   Social History  . Marital status: Divorced    Spouse name: N/A  . Number of children: N/A  . Years of education: N/A   Social History Main Topics  . Smoking status: Former Smoker    Packs/day: 0.50    Years: 2.00    Types: Cigarettes    Quit date: 10/16/1946  . Smokeless tobacco: Never Used  . Alcohol use Not on file  . Drug use: Unknown  . Sexual activity: Not on file   Other Topics Concern  . Not on file   Social History Narrative  . No narrative on file    ECOG Status: 2 - Symptomatic, <50% confined to bed  Review of Systems: A 12 point ROS discussed and pertinent positives are indicated in the HPI above.  All other systems are negative.  Review of Systems  Vital Signs: BP (!) 146/64 (BP Location: Right Arm, Patient Position: Sitting, Cuff Size: Normal)   Pulse 69   Temp 98 F (36.7 C) (Oral)   Resp 14   SpO2 98% Comment: O2 at 2L/nasal cannula  Physical Exam  Mallampati Score:     Imaging: Dg Thoracic Spine 2 View  Result Date: 01/22/2016 CLINICAL DATA:  82 year old female with sharp mid thoraco lumbar spine pain. History of prior lumbar compression fractures. EXAM: THORACIC SPINE 2 VIEWS COMPARISON:  Concurrently obtained radiographs of the lumbar spine ; most recent CT scan of the chest 07/03/2015 FINDINGS: No evidence of acute thoracic compression fracture. Multilevel  degenerative endplate spurring. Incompletely imaged changes of cement augmentation at L3. Extensive calcification throughout the mitral valve annulus consistent with caseous calcification. Calcifications present in the aorta. The visualized lungs are clear. IMPRESSION: 1. No evidence of acute thoracic compression fracture or malalignment. 2. Caseous calcification of the mitral valve annulus. 3.  Aortic Atherosclerosis (ICD10-170.0). Electronically Signed   By: Malachy Moan M.D.   On: 01/22/2016 13:50   Dg Lumbar Spine 2-3 Views  Result Date: 01/22/2016 CLINICAL DATA:  82 year old female with sharp mid thoraco lumbar spine pain for the past 2 days. Recent L3 kyphoplasty performed 01/06/2016. EXAM: LUMBAR SPINE - 2-3 VIEW COMPARISON:  Lumbar spine radiographs 11/10/2015; CT lumbar spine 12/12/2015  FINDINGS: Changes of prior L3 kyphoplasty. There may be some interval fragmentation of the anterior aspect of the vertebral body. Stable approximately 25% superior endplate compression fracture at L5. No new fracture or malalignment identified. Atherosclerotic calcifications of visible in the abdominal aorta. IMPRESSION: 1. No new fracture or malalignment identified. 2. Stable L3 compression fracture with interval changes of cement augmentation. 3. Stable L5 compression fracture. 4.  Aortic Atherosclerosis (ICD10-170.0) Electronically Signed   By: Malachy Moan M.D.   On: 01/22/2016 13:48   Ir Kypho Lumbar Inc Fx Reduce Bone Bx Uni/bil Cannulation Inc/imaging  Result Date: 01/06/2016 CLINICAL DATA:  bilateral low back pain, and a L3 compression fracture . She also has a old compression fracture at L5. She describes a fall about 6 months ago. Serial imaging shows progressive loss of height at the L3 subacute compression fracture. Her pain is incompletely controlled by by mouth aspirin and Lidoderm patches. She does not tolerate narcotic level pain medication because of neurologic side effects. Previously, she  walked without assistance. Now she walks with significant difficulty using a cane, with decreased mobility. EXAM: KYPHOPLASTY AT LUMBAR L3 TECHNIQUE: The procedure, risks (including but not limited to bleeding, infection, organ damage), benefits, and alternatives were explained to the patient and family. Questions regarding the procedure were encouraged and answered. The patient understands and consents to the procedure. The patient was placed prone on the fluoroscopic table. The skin overlying the lumbar region was then prepped and draped in the usual sterile fashion. Maximal barrier sterile technique was utilized including caps, mask, sterile gowns, sterile gloves, sterile drape, hand hygiene and skin antiseptic. Intravenous Fentanyl and Versed were administered as conscious sedation during continuous cardiorespiratory monitoring by the radiology RN, with a total moderate sedation time of43 minutes. As antibiotic prophylaxis, cefazolin 2 g was ordered pre-procedure and administered intravenously within !one hour! of incision. The right pedicle at lumbar L3 was then infiltrated with 1% lidocaine followed by the advancement of a Kyphon trocar needle through the right pedicle into the posterior one-third. The trocar was removed and the osteo drill was advanced to the anterior third of the vertebral body. The osteo drill was retracted. Through the working cannula, a Kyphon inflatable bone tamp 15 x 3 was advanced and positioned with the distal marker 5 mm from the anterior aspect of the cortex. Crossing of the midline was not seen on the AP projection. At this time, the balloon was expanded using contrast via a Kyphon inflation syringe device via micro tubing. In similar fashion, the left pedicle was infiltrated with 1% lidocaine followed by the advancement of a second Kyphon trocar needle through the left pedicle into the posterior third of the vertebral body. The osteo drill was coaxially advanced to the anterior  right third. The osteo drill was exchanged for a Kyphon inflatable bone tamp 15 x 3, advanced to the 5 mm of the anterior aspect of the cortex. The balloon was then expanded using contrast as above. Inflations were continued until there was near apposition across the midline and with the superior and the inferior end plates. At this time, methylmethacrylate mixture was reconstituted in the Kyphon bone mixing device system. This was then loaded into the delivery mechanism, attached to Kyphon bone fillers. The balloons were deflated and removed followed by the instillation of methylmethacrylate mixture with excellent filling in the AP and lateral projections. No extravasation was noted in the disk spaces or posteriorly into the spinal canal. No epidural venous contamination was seen. The  patient tolerated the procedure well. There were no acute complications. The working cannulae and the bone filler were then retrieved and removed. COMPLICATIONS: COMPLICATIONS None immediate. IMPRESSION: 1. Status post vertebral body augmentation using balloon kyphoplasty at lumbar L3 as described without event. 2. Per CMS PQRS reporting requirements (PQRS Measure 24): Given the patient's age of greater than 50 and the fracture site (hip, distal radius, or spine), the patient should be tested for osteoporosis using DXA, and the appropriate treatment considered based on the DXA results. Electronically Signed   By: Corlis Leak  Jordan Barker M.D.   On: 01/06/2016 15:43    Labs:  CBC:  Recent Labs  03/24/15 1216 06/23/15 1106 06/25/15 1019 01/06/16 1150  WBC 4.7 6.0 6.4 5.8  HGB 9.8* 9.6* 9.2* 9.9*  HCT 30.2* 29.0* 28.4* 30.1*  PLT 125.0* 138.0* 126* 149*    COAGS:  Recent Labs  01/06/16 1150  INR 1.08    BMP:  Recent Labs  03/24/15 1216 04/22/15 0839 06/23/15 1106 06/25/15 1020 01/06/16 1150  NA 139 140 138 140 135  K 4.4 4.1 4.0 3.8 4.4  CL 104 105 101  --  95*  CO2 30 30 31 28  32  GLUCOSE 100* 122* 138* 109  109*  BUN 19 19 20  24.4 14  CALCIUM 9.3 9.2 9.3 9.0 9.2  CREATININE 1.27* 1.35* 1.44* 1.4* 1.14*  GFRNONAA  --   --   --   --  40*  GFRAA  --   --   --   --  46*    LIVER FUNCTION TESTS:  Recent Labs  06/25/15 1020  BILITOT 0.51  AST 19  ALT <9  ALKPHOS 66  PROT 6.8  ALBUMIN 3.7    TUMOR MARKERS: No results for input(s): AFPTM, CEA, CA199, CHROMGRNA in the last 8760 hours.  Assessment and Plan:  My impression is that Ms. Tkach has some residual low back pain after technically successful L3 kyphoplasty. Follow-up CT showed no apparent complication from L3 kyphoplasty. Old L5 compression deformity is stable. No new fracture. Stable mild stenosis of the left lobe of L3 retropulsion. There is no evidence of an osseous etiology of her residual pain. She does not tolerate narcotics level by mouth pain medications, nor tramadol, and her pain is incompletely controlled by her Tylenol medication regimen.   There is no evidence of new compression fracture. I suspect exacerbation of underlying degenerative and spondylitic changes related to altered biomechanics from her compression deformities. Such pain often responds  well to epidural steroid injection with good long-term results. I discussed the issue at length with the patient and her son.   We discussed the epidural steroid injection technique, anticipated benefits, time course of symptom resolution, and ability to follow up with additional injections as needed. This seemed to understand and were motivated to proceed.   We'll need to make sure she has resolved her UTI first. Once this is documented from her PCP, we can reassess her residual pain, and if persistent and significant, we can set her up for lumbar epidural steroid injection as an outpatient.   Thank you for this interesting consult.  I greatly enjoyed meeting Jordan Barker and look forward to participating in their care.  A copy of this report was sent to the requesting provider on  this date.  Electronically Signed: Artist Bloom III, DAYNE Milinda Sweeney 02/04/2016, 10:23 AM   I spent a total of    40 Minutes in face to face in clinical consultation, greater than 50%  of which was counseling/coordinating care for lumbago post L3 kyphoplasty.

## 2016-02-26 ENCOUNTER — Telehealth: Payer: Self-pay | Admitting: Radiology

## 2016-02-26 NOTE — Telephone Encounter (Signed)
Spoke w/ patient's son, Jennette Kettleeal.  Patient is uncertain as to whether she wants to schedule epidural steroid injection at this time.  The family will call back should she decide to schedule an injection.  Kervens Roper Carmell AustriaGales, RN 02/26/2016 10:21 AM

## 2016-02-27 ENCOUNTER — Ambulatory Visit (INDEPENDENT_AMBULATORY_CARE_PROVIDER_SITE_OTHER): Payer: Medicare Other | Admitting: Cardiology

## 2016-02-27 ENCOUNTER — Encounter: Payer: Self-pay | Admitting: Cardiology

## 2016-02-27 VITALS — BP 128/80 | HR 79 | Ht 64.0 in | Wt 142.8 lb

## 2016-02-27 DIAGNOSIS — I10 Essential (primary) hypertension: Secondary | ICD-10-CM

## 2016-02-27 DIAGNOSIS — I5032 Chronic diastolic (congestive) heart failure: Secondary | ICD-10-CM | POA: Diagnosis not present

## 2016-02-27 DIAGNOSIS — I4891 Unspecified atrial fibrillation: Secondary | ICD-10-CM | POA: Insufficient documentation

## 2016-02-27 NOTE — Progress Notes (Signed)
02/27/2016 Jordan Barker   03/20/1919  [id number]  Primary Physician Jordan Barker,JANE, Barker Primary Cardiologist: Jordan Barker (OSA)   Reason for Visit/CC: [facility]; chronic diastolic HF  HPI:  The patient is a 82 y/o female with h/o HTN,  tobacco use, interstitial lung disease, moderate to severe pulmonary HTN, chronic diastolic dysfunction on daily lasix, stage 3 CKD with baseline SCr ~1.4 and GFR of 40, OSA compliant with CPAP, h/o lung nodule, chronic respiratory hypoxia, on chronic home O2, chronic anemia with baseline Hgb ~9. She also has a h/o frequent nosebleeds with ASA. She has fallen at least once in the past 6 months. She was seen in the past by Jordan Barker. He saw her 10/04/14 for a second option regarding her dyspnea. He felt her dyspnea was primarily due to her pulmonary issues and recommended referral back to pulmonology and f/u with Jordan Barker for management of her OSA. Jordan Barker saw her more recently in April. She was doing well. Instructed to return in 1 year.   She presents to clinic today for post hospital f/u. [facility] due to abnormal lab values. Patient was being seen for routine f/u and labs showed elevated CO2  Levels. Patient was asymptomatic. Per Care Everywhere, "[facility] Barker, [facility], states the patient is well known to him from previous admission. He advises patient has obstructive sleep apnea and this CO2 level is not uncommon for her and is her baseline. If patient is asymptomatic she can be discharged. 11/03".  It appears that she was also recently treated for a UTI as well as anemia for which she required transfusion with 2 units of blood. Pt was sent home once stable and instructed to f/u with her PCP.    She is here in clinic today with her son. She was brought in with use of a wheelchair. She is on oxygen. She reports that she has done fairly well since discharge. She continues to have chronic stable bilateral lower  extremity edema and continues to take Lasix. She elevates her legs to help with edema when she's sitting at home. She denies any increased oxygen demands. She has completed her course of antibiotics for her UTI. She denies chest pain. Her EKG today appears to demonstrate atrial fibrillation with a controlled ventricular response in the 70s. She is asymptomatic with this. This is a new finding for us. I reviewed her records [facility] in Care Everywhere. It was outlined in her hospital chart that EKG during that admission also showed atrial fibrillation. Her blood pressure is stable. She is on beta blocker therapy with Jordan Barker. She denies h/o stroke.   Current Meds  Medication Sig  . albuterol (PROVENTIL HFA;VENTOLIN HFA) 108 (90 Base) MCG/ACT inhaler Inhale 2 puffs into the lungs every 6 (six) hours as needed.  Marland Kitchen. alendronate (FOSAMAX) 70 MG tablet Take 70 mg by mouth once a week. Take with a full glass of water on an empty stomach.  Takes on Saturdays.  Marland Kitchen. aspirin 81 MG tablet Take 81 mg by mouth daily as needed (pain).   Marland Kitchen. beta carotene w/minerals (OCUVITE) tablet Take 1 tablet by mouth daily.  . calcium-vitamin D (OSCAL WITH D) 250-125 MG-UNIT tablet Take 1 tablet by mouth 2 (two) times daily. Takes twice daily with meals.  . doxazosin (CARDURA) 2 MG tablet Take 1 mg by mouth 2 (two) times daily.   . furosemide (LASIX) 40 MG tablet Take  40 mg by mouth.  Marland Kitchen lisinopril (PRINIVIL,ZESTRIL) 40 MG tablet Take 40 mg by mouth daily.  . metoprolol succinate (TOPROL-XL) 100 MG 24 hr tablet Take 0.5 tablets by mouth 2 (two) times daily.  Marland Kitchen omeprazole (PRILOSEC) 20 MG capsule Take 20 mg by mouth daily.  . OXYGEN as directed. 2L/MIN BY CANNULA  . traMADol (ULTRAM) 50 MG tablet Take 1 tablet (50 mg total) by mouth every 6 (six) hours as needed.  . Vitamin Mixture (VITAMIN E COMPLETE) CAPS Take 1 capsule by mouth daily as needed (DIZZINESS).   Allergies  Allergen Reactions  . Amlodipine  Shortness Of Breath and Other (See Comments)    REACTION: "hot/cold flashes"  . Celecoxib Anxiety and Rash  . Chlorthalidone Anxiety and Rash  . Hydrochlorothiazide Anxiety and Rash  . Sulfa Antibiotics Other (See Comments)    REACTION: "UNKNOWN...BEEN SO LONG AGO"   Past Medical History:  Diagnosis Date  . Chronic diastolic CHF (congestive heart failure) (HCC)   . Chronic respiratory failure (HCC)   . CKD (chronic kidney disease), stage III   . Essential hypertension   . Interstitial lung disease (HCC)   . Mitral regurgitation    a. Echo 05/2014 - mild-mod MR.  . Moderate to severe pulmonary hypertension    Echo 05/2014 PASP 61 mmHg   . On home oxygen therapy    Commencing 05/2014   . OSA (obstructive sleep apnea)   . Pulmonary nodule    a. Followed by pulm.  . Tricuspid regurgitation    a. Echo 05/2014 - mod-sev TR.   Family History  Problem Relation Age of Onset  . Hypertension Mother   . Ovarian cancer Mother   . Hypertension Father   . Parkinson's disease Father   . Heart disease Brother   . Cancer Brother   . Parkinson's disease Brother   . Prostate cancer Brother   . Heart disease Brother   . Hypertension Brother   . Congestive Heart Failure Brother   . Bone cancer Brother    Past Surgical History:  Procedure Laterality Date  . IR GENERIC HISTORICAL  01/06/2016   IR KYPHO LUMBAR INC FX REDUCE BONE BX UNI/BIL CANNULATION INC/IMAGING 01/06/2016 Jordan Barker [facility]   Social History   Social History  . Marital status: Divorced    Spouse name: N/A  . Number of children: N/A  . Years of education: N/A   Occupational History  . Not on file.   Social History Main Topics  . Smoking status: Former Smoker    Packs/day: 0.50    Years: 2.00    Types: Cigarettes    Quit date: 10/16/1946  . Smokeless tobacco: Never Used  . Alcohol use Not on file  . Drug use: Unknown  . Sexual activity: Not on file   Other Topics Concern  . Not on file   Social  History Narrative  . No narrative on file     Review of Systems: General: negative for chills, fever, night sweats or weight changes.  Cardiovascular: negative for chest pain, dyspnea on exertion, edema, orthopnea, palpitations, paroxysmal nocturnal dyspnea or shortness of breath Dermatological: negative for rash Respiratory: negative for cough or wheezing Urologic: negative for hematuria Abdominal: negative for nausea, vomiting, diarrhea, bright red blood per rectum, melena, or hematemesis Neurologic: negative for visual changes, syncope, or dizziness All other systems reviewed and are otherwise negative except as noted above.   Physical Exam:  Blood pressure 128/80, pulse 79, height 5\' 4"  (  1.626 m), weight 142 lb 12.8 oz (64.8 kg).  General appearance: alert, cooperative, no distress and elderly and frail, wheelchair with supplemental O2 Neck: no carotid bruit and no JVD Lungs: decreased BS bilaterally, no rales or wheezes Heart: irregularly irregular rhythm and regular rate Extremities: 1+ bilateral pedal edema Pulses: 2+ and symmetric Skin: Skin color, texture, turgor normal. No rashes or lesions Neurologic: Grossly normal  EKG Atrial Fibrillation with a CVR  ASSESSMENT AND PLAN:   1. Atrial Fibrillation: new diagnosis. I reviewed [facility] records [facility] in Care Everywhere. It was outlined in her hospital chart that EKG during her most recent admission in October also showed atrial fibrillation. She is asymptomatic with this. Her ventricular rate is well controlled in the 70s on cardioselective beta blocker, metoprolol XL, 50 mg. Her blood pressure is stable. She denies h/o stroke however has multiple risk factors, including HTN, CHF, Age 6>75 and female sex, elevating her CHA2DS2 VASc score to 5. However she is not a candidate for a/c given advanced age, bleed risk given symptomatic chronic anemia, recently requiring blood transfusion, frequent nose bleeds as well as  high fall risk. Continue rate control management w/o anticoagulation. [facility] TSH was elevated at 5.24. Free T3/T4 not assessed. Pt advised to f/u with her PCP for further management. It is also likely that her recent UTI and anemia may have triggered her arrhythmia. She has completed her antibiotic course for her UTI and is no longer symptomatic. Her anemia was treated with transfusion x 2 units. She is compliant with CPAP.  2. Chronic Diastolic HF: no rales on lung exam. She has 1+ bilateral LEE. She is on lasix. Patient advised to elevate legs to help with edema, while at home in seated position. Low sodium diet. Continue BB.   3. ILD: on chronic home O2. Followed by pulmonology.   4. Anemia: s/p recent transfusion. Last Hgb was 9. PCP following.   5. OSA: compliant with CPAP. Managed by Jordan Barker. 1 year f/u planned for April 2018.  6. HTN: BP controlled on current regimen.  7. Pulmonary HTN: moderate to severe. On chronic home O2.   8. Stage 3 CKD: baseline SCr ~1.4 and GFR of 40  F/u  Keep f/u with Jordan Barker 07/2016 for OSA management   Robbie LisBrittainy Helane Briceno PA-C 02/27/2016 12:40 PM

## 2016-02-27 NOTE — Patient Instructions (Signed)
Medication Instructions:  Your physician recommends that you continue on your current medications as directed. Please refer to the Current Medication list given to you today.   Labwork: None ordered  We have reviewed your lab results form your recent hospitalization [facility]. Please follow up with your primary physician for further recommendations.  Testing/Procedures: None ordered  Follow-Up: Your physician wants you to follow-up in: April 2018 as planned with Dr.Turner You will receive a reminder letter in the mail two months in advance. If you don't receive a letter, please call our office to schedule the follow-up appointment.   Any Other Special Instructions Will Be Listed Below (If Applicable).     If you need a refill on your cardiac medications before your next appointment, please call your pharmacy.

## 2016-03-09 ENCOUNTER — Encounter: Payer: Self-pay | Admitting: Interventional Radiology

## 2016-04-22 ENCOUNTER — Encounter: Payer: Self-pay | Admitting: Interventional Radiology

## 2016-05-03 IMAGING — CT CT ANGIO CHEST
2 of 6 series · 18 of 36 positions shown · IV contrast (Omnipaque 300)
Comparison: None.

CLINICAL DATA: Home oxygen. Pulmonary hypertension. Sleep apnea.
Shortness of breath.

EXAM:
CT ANGIOGRAPHY CHEST WITH CONTRAST
TECHNIQUE: Multidetector CT imaging of the chest was performed using the
standard protocol during bolus administration of intravenous
contrast. Multiplanar CT image reconstructions and MIPs were
obtained to evaluate the vascular anatomy.
CONTRAST:  80mL OMNIPAQUE IOHEXOL 350 MG/ML SOLN

[Series 5: thins 1.0mm / 0.8mm · axial · 0.65mm/px · z∈[-257,-5]mm · 17 of 282 slices shown]
[im 15/282  lung]
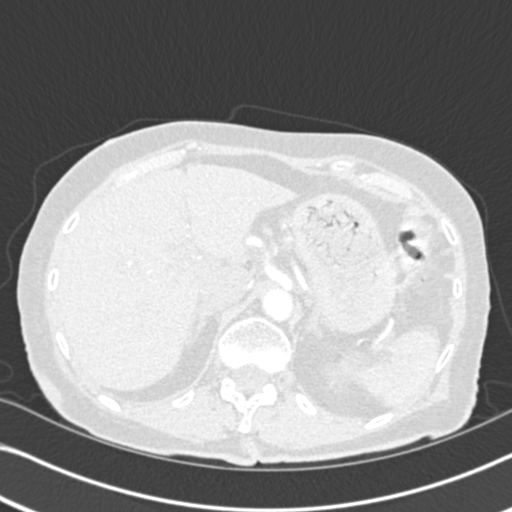
[im 29/282  mediastinal]
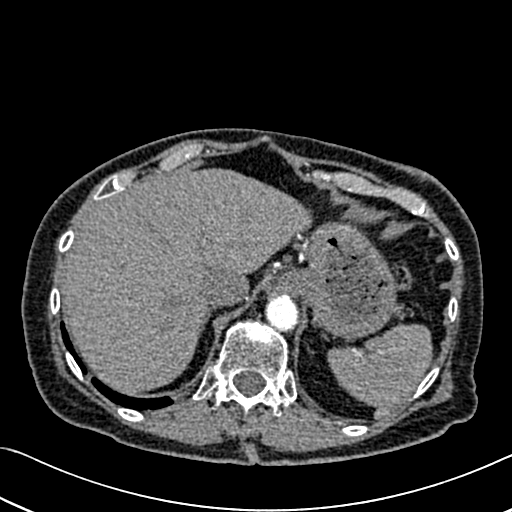
[im 43/282  lung]
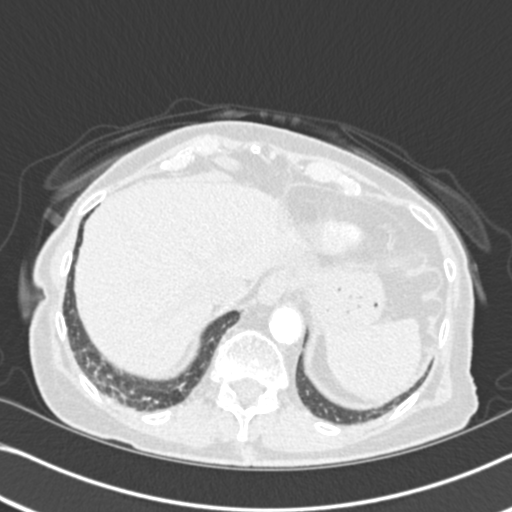
[im 57/282  mediastinal]
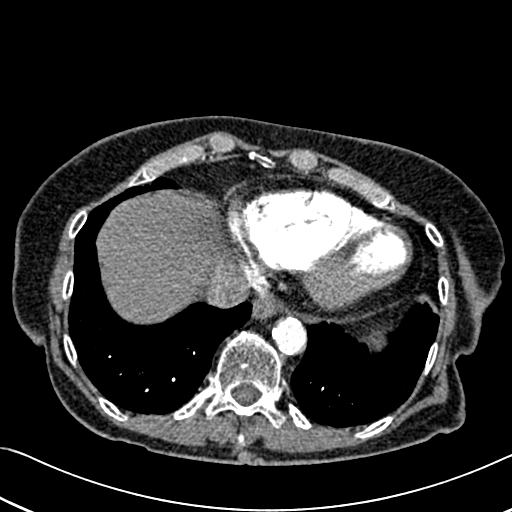
[im 85/282  lung]
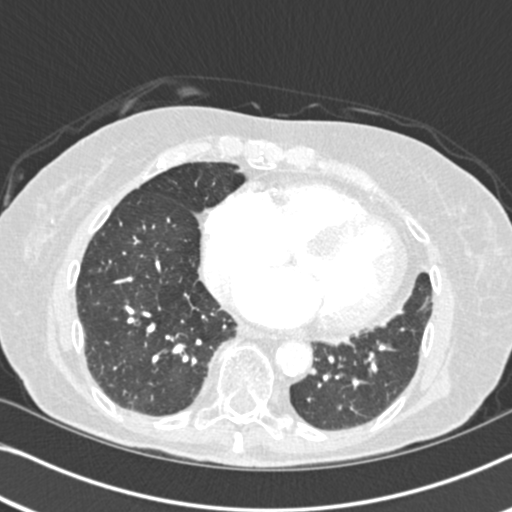
[im 99/282  mediastinal]
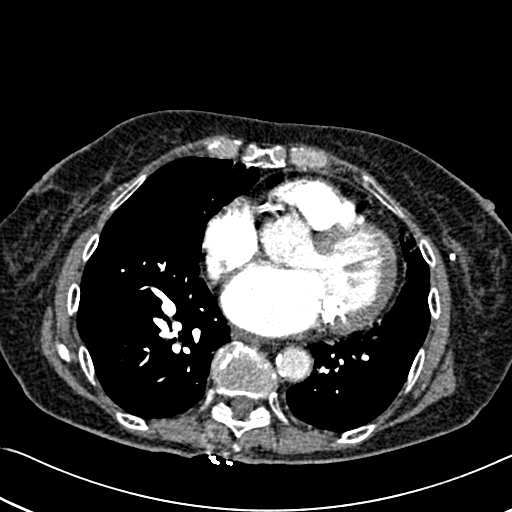
[im 113/282  lung]
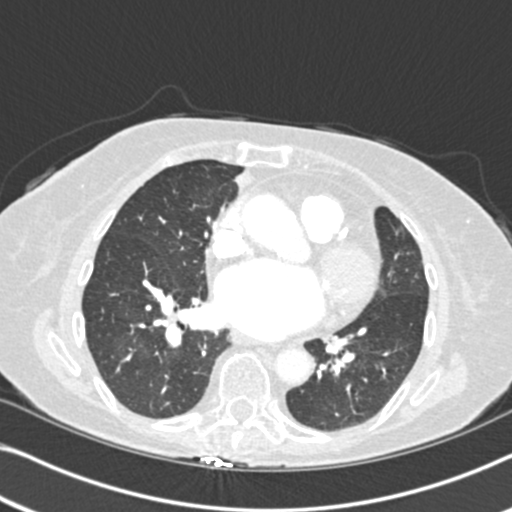
[im 127/282  mediastinal]
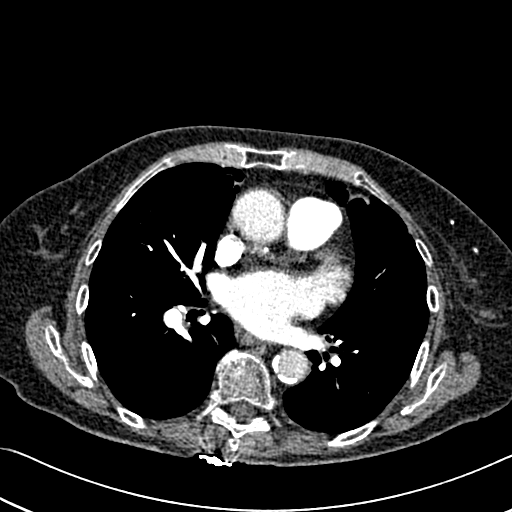
[im 141/282  lung]
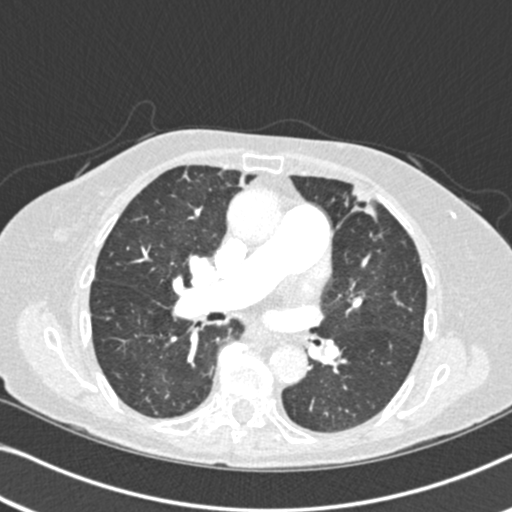
[im 155/282  mediastinal]
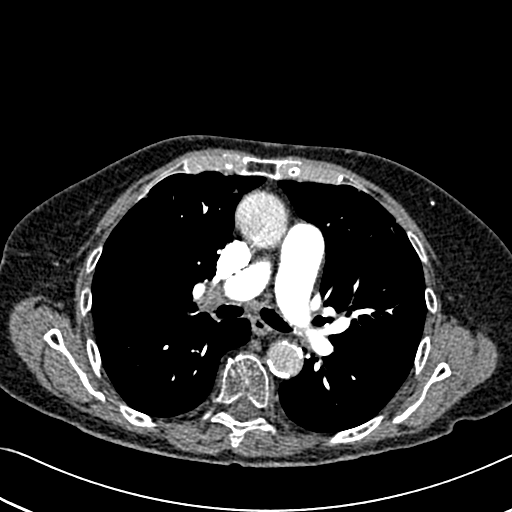
[im 169/282  lung]
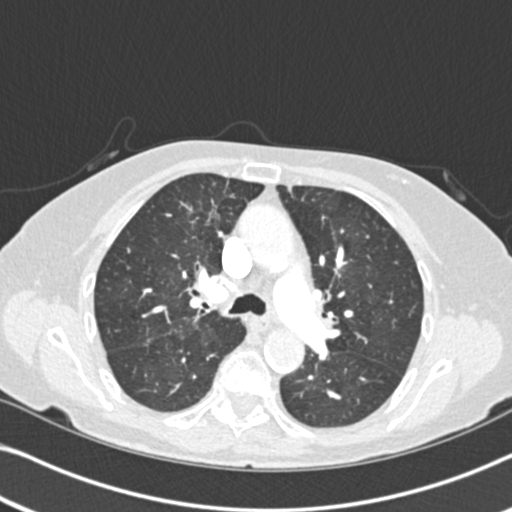
[im 183/282  mediastinal]
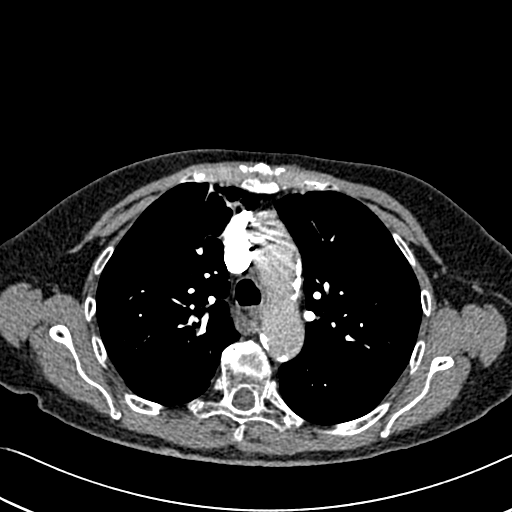
[im 197/282  lung]
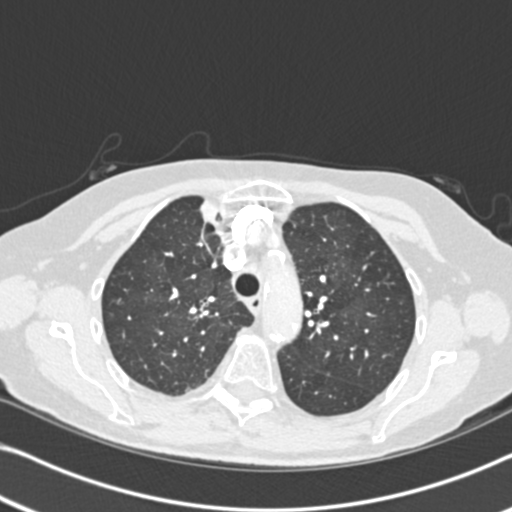
[im 225/282  mediastinal]
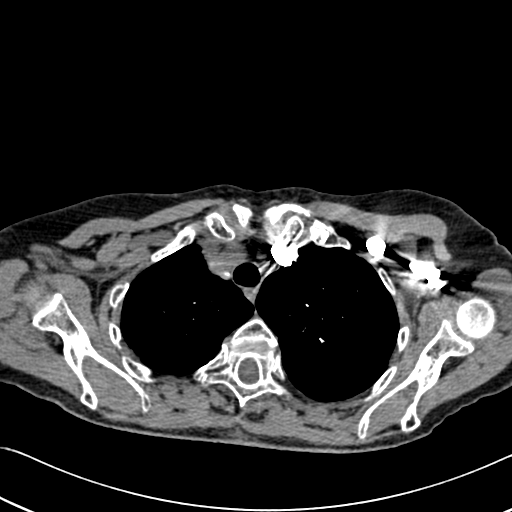
[im 239/282  lung]
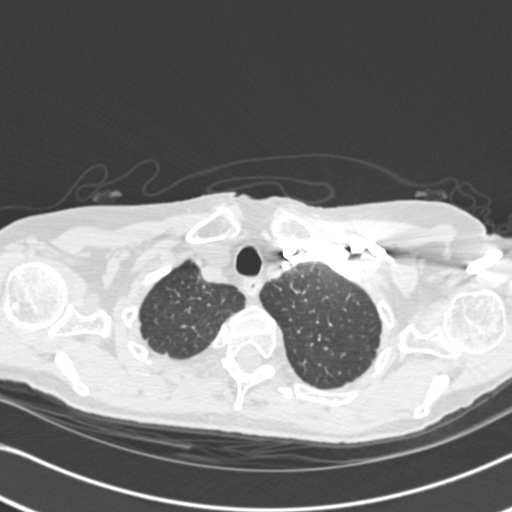
[im 253/282  mediastinal]
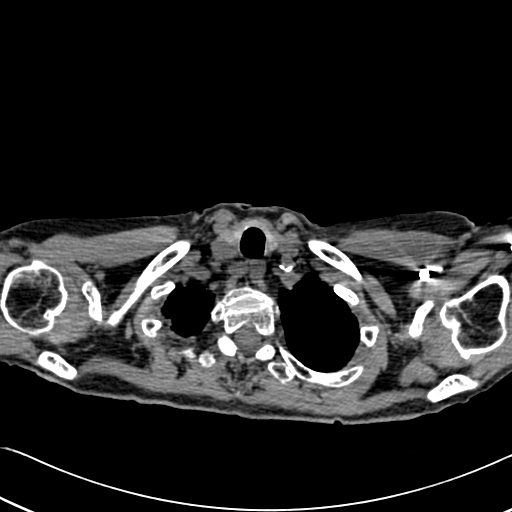
[im 267/282  lung]
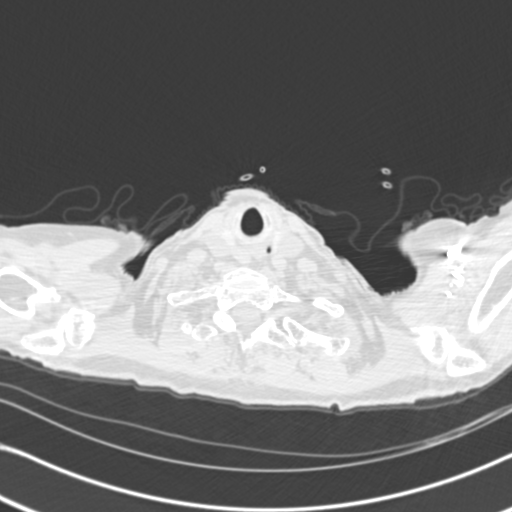

[Series 602: cor mpr · coronal · 0.65mm/px · 1 of 101 slices shown]
[im 51/101  mediastinal]
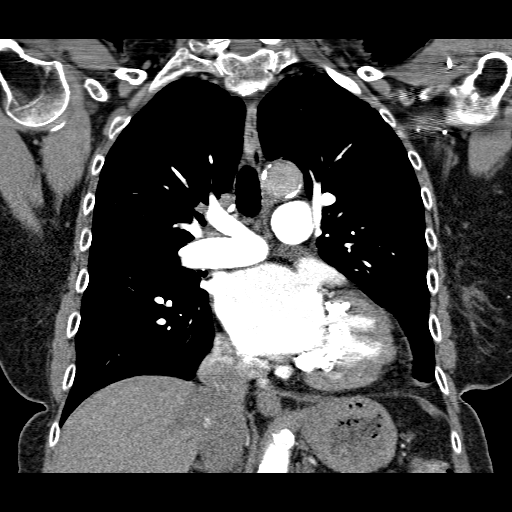

[18 of 36 positions shown; findings below may reference images not displayed]

FINDINGS: Mediastinum/Nodes: No filling defect is identified in the pulmonary
arterial tree to suggest pulmonary embolus. Atherosclerotic
calcification of the coronary arteries, aortic arch, and branch
vessels noted. Dense mitral valve calcification. Mild cardiomegaly.

Enlarged right hilar lymph node at 1.4 cm, image 42 series 4.

Lungs/Pleura: Biapical pleural parenchymal scarring, left greater
than right. Scarring and volume loss medially in the right middle
lobe.

0.7 by 0.5 cm right lower lobe pulmonary nodule, image 71 series 6.

Lingular scarring noted. Mild atelectasis anteriorly in the left
lower lobe.

Faint mosaic attenuation in the lungs, mild vascular attenuation in
the darker segments.

Upper abdomen: Unremarkable

Musculoskeletal: Thoracic spondylosis.

Review of the MIP images confirms the above findings.
IMPRESSION: 1. No filling defect is identified in the pulmonary arterial tree to
suggest acute pulmonary embolus.
2. Mild right hilar adenopathy is nonspecific and could be
inflammatory or neoplastic.
3. Average size 6 mm pulmonary nodule in the right lower lobe. If
the patient is at high risk for bronchogenic carcinoma, follow-up
chest CT at 6-12 months is recommended. If the patient is at low
risk for bronchogenic carcinoma, follow-up chest CT at 12 months is
recommended. This recommendation follows the consensus statement:
Guidelines for Management of Small Pulmonary Nodules Detected on CT
Scans: A Statement from the [HOSPITAL] as published in
4. Extensive coronary atherosclerosis with mild cardiomegaly.
5. Calcified mitral valve.
6. Scattered scarring or mild atelectasis in both lungs.
7. Mosaic attenuation in the lungs; vascular attenuation in the
darker segments suggests obstructive bronchiolitis or (less likely)
residua from chronic pulmonary embolus.

## 2016-05-20 DEATH — deceased

## 2016-06-28 ENCOUNTER — Ambulatory Visit: Payer: Medicare Other | Admitting: Pulmonary Disease

## 2017-03-12 IMAGING — DX DG CHEST 2V
2 series · 2 of 2 positions shown · non-contrast
Comparison: 10/16/2014

CLINICAL DATA: Moderate to severe pulmonary hypertension

EXAM:
CHEST  2 VIEW

[chest pa]
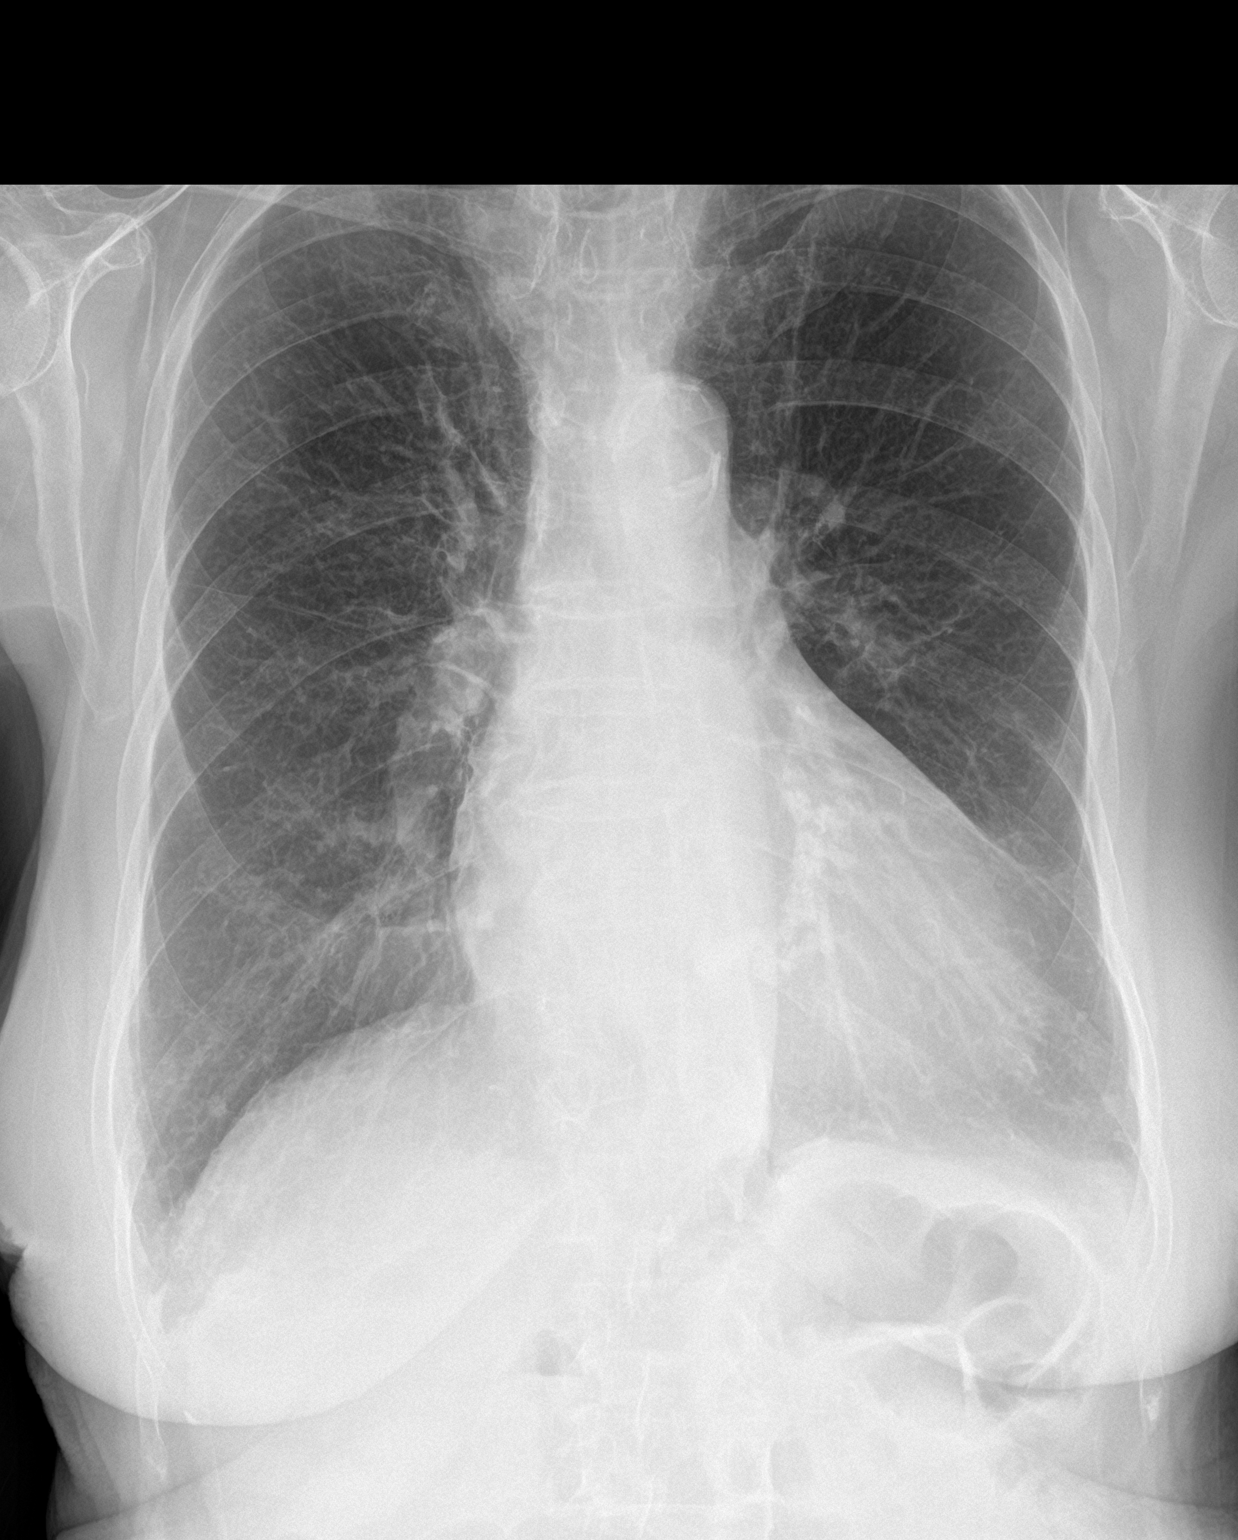

[chest lat]
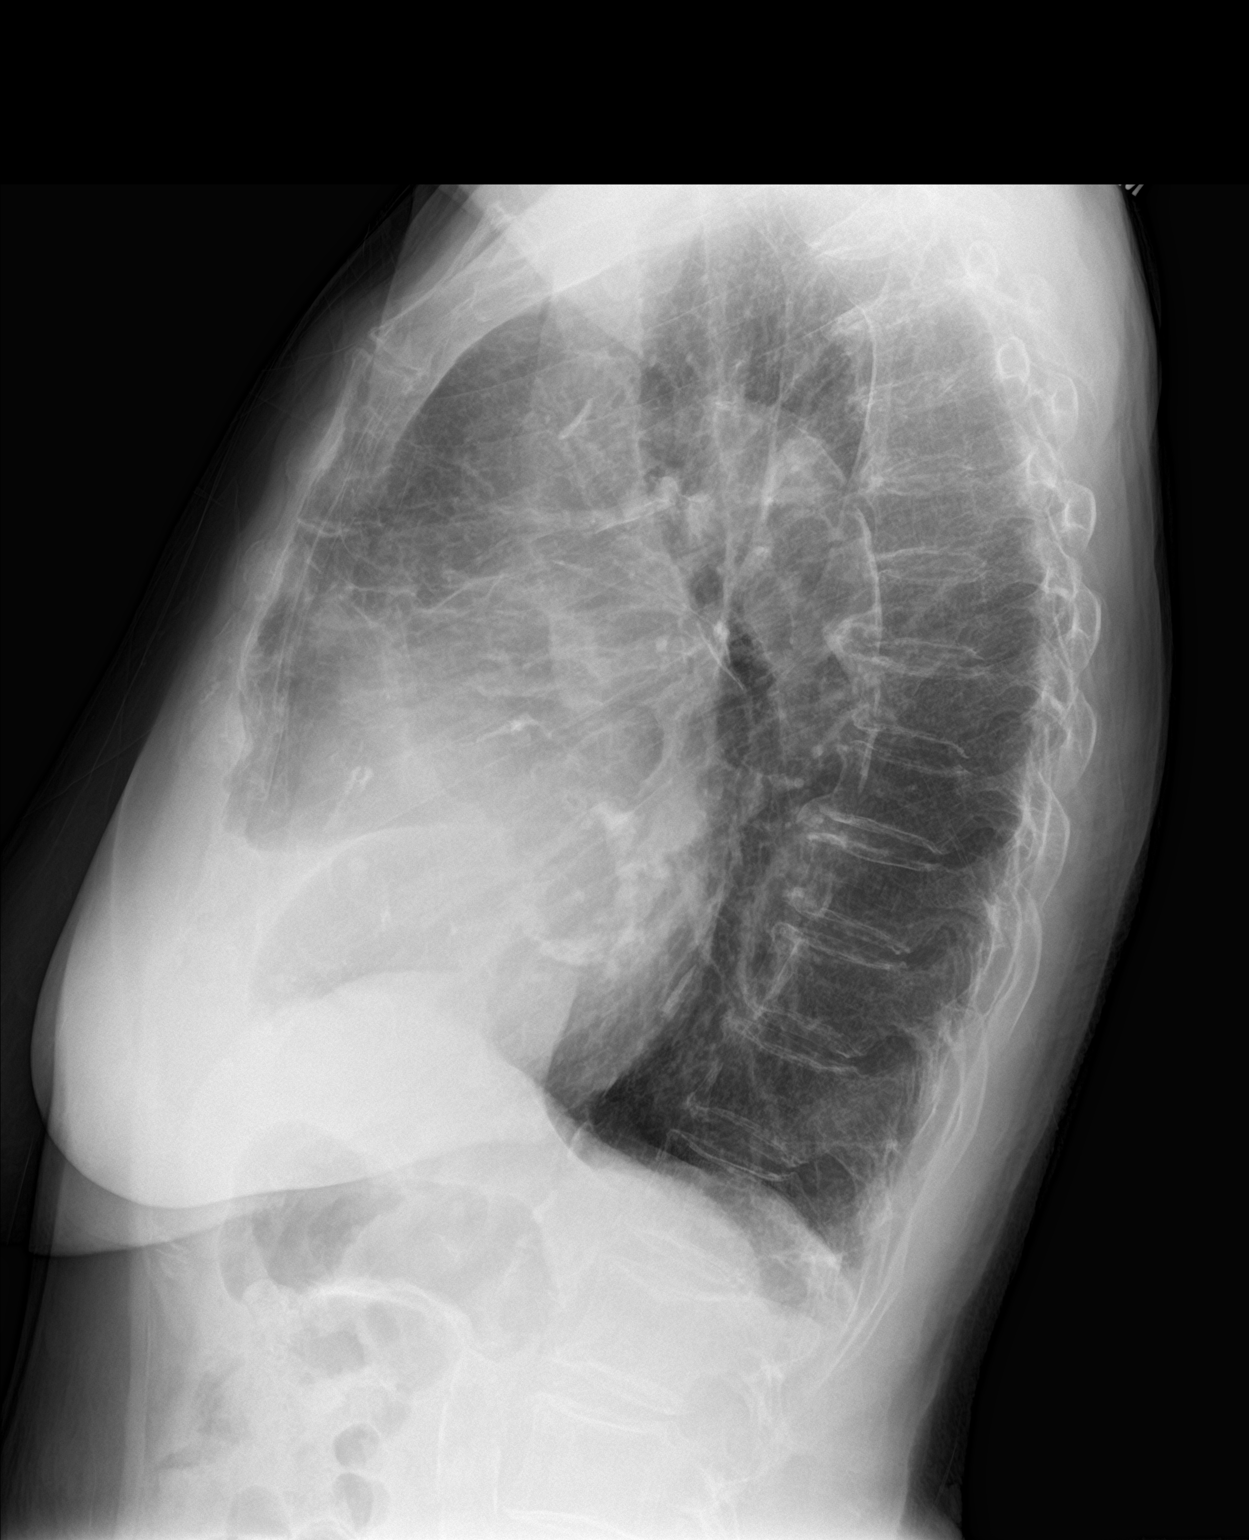

[2 of 2 positions shown; findings below may reference images not displayed]

FINDINGS: Cardiomediastinal silhouette is stable. Mild hyperinflation again
noted. Atherosclerotic calcifications of thoracic aorta again noted.
Osteopenia and mild degenerative changes thoracic spine. No
infiltrate or pulmonary edema. Stable 6 mm nodule in right base
laterally.
IMPRESSION: No active disease. Hyperinflation again noted. Stable 6 mm nodule in
right base laterally.
